# Patient Record
Sex: Female | Born: 1955
Health system: Southern US, Community
[De-identification: ages and names within clinical notes are randomized; demographics above are authoritative.]

## PROBLEM LIST (undated history)

## (undated) DIAGNOSIS — D219 Benign neoplasm of connective and other soft tissue, unspecified: Secondary | ICD-10-CM

## (undated) DIAGNOSIS — J189 Pneumonia, unspecified organism: Secondary | ICD-10-CM

## (undated) DIAGNOSIS — K635 Polyp of colon: Secondary | ICD-10-CM

## (undated) DIAGNOSIS — Z9109 Other allergy status, other than to drugs and biological substances: Secondary | ICD-10-CM

## (undated) DIAGNOSIS — E079 Disorder of thyroid, unspecified: Secondary | ICD-10-CM

## (undated) HISTORY — DX: Disorder of thyroid, unspecified: E07.9

## (undated) HISTORY — PX: VEIN SURGERY: SHX48

## (undated) HISTORY — PX: BASAL CELL CARCINOMA EXCISION: SHX1214

## (undated) HISTORY — DX: Polyp of colon: K63.5

## (undated) HISTORY — DX: Other allergy status, other than to drugs and biological substances: Z91.09

## (undated) HISTORY — DX: Pneumonia, unspecified organism: J18.9

## (undated) HISTORY — DX: Benign neoplasm of connective and other soft tissue, unspecified: D21.9

---

## 1997-06-20 HISTORY — PX: KNEE ARTHROSCOPY: SUR90

## 1998-07-16 ENCOUNTER — Ambulatory Visit (HOSPITAL_BASED_OUTPATIENT_CLINIC_OR_DEPARTMENT_OTHER): Admission: RE | Admit: 1998-07-16 | Discharge: 1998-07-16 | Payer: Self-pay | Admitting: Orthopedic Surgery

## 2000-06-15 ENCOUNTER — Other Ambulatory Visit: Admission: RE | Admit: 2000-06-15 | Discharge: 2000-06-15 | Payer: Self-pay | Admitting: Obstetrics and Gynecology

## 2001-03-07 ENCOUNTER — Encounter: Payer: Self-pay | Admitting: Obstetrics and Gynecology

## 2001-03-07 ENCOUNTER — Encounter: Admission: RE | Admit: 2001-03-07 | Discharge: 2001-03-07 | Payer: Self-pay | Admitting: Obstetrics and Gynecology

## 2001-09-13 ENCOUNTER — Other Ambulatory Visit: Admission: RE | Admit: 2001-09-13 | Discharge: 2001-09-13 | Payer: Self-pay | Admitting: Obstetrics and Gynecology

## 2002-10-11 ENCOUNTER — Other Ambulatory Visit: Admission: RE | Admit: 2002-10-11 | Discharge: 2002-10-11 | Payer: Self-pay | Admitting: Obstetrics and Gynecology

## 2003-01-08 ENCOUNTER — Encounter: Admission: RE | Admit: 2003-01-08 | Discharge: 2003-01-08 | Payer: Self-pay | Admitting: Obstetrics and Gynecology

## 2003-01-08 ENCOUNTER — Encounter: Payer: Self-pay | Admitting: Obstetrics and Gynecology

## 2003-12-31 ENCOUNTER — Other Ambulatory Visit: Admission: RE | Admit: 2003-12-31 | Discharge: 2003-12-31 | Payer: Self-pay | Admitting: Obstetrics and Gynecology

## 2004-09-13 ENCOUNTER — Ambulatory Visit: Payer: Self-pay | Admitting: Internal Medicine

## 2005-04-26 ENCOUNTER — Other Ambulatory Visit: Admission: RE | Admit: 2005-04-26 | Discharge: 2005-04-26 | Payer: Self-pay | Admitting: Obstetrics and Gynecology

## 2005-05-05 ENCOUNTER — Encounter: Admission: RE | Admit: 2005-05-05 | Discharge: 2005-05-05 | Payer: Self-pay | Admitting: Obstetrics and Gynecology

## 2005-06-02 ENCOUNTER — Ambulatory Visit: Payer: Self-pay | Admitting: Internal Medicine

## 2006-05-02 ENCOUNTER — Ambulatory Visit: Payer: Self-pay | Admitting: Internal Medicine

## 2006-06-20 DIAGNOSIS — K635 Polyp of colon: Secondary | ICD-10-CM

## 2006-06-20 HISTORY — DX: Polyp of colon: K63.5

## 2006-07-17 ENCOUNTER — Encounter: Admission: RE | Admit: 2006-07-17 | Discharge: 2006-07-17 | Payer: Self-pay | Admitting: Obstetrics and Gynecology

## 2006-07-18 ENCOUNTER — Other Ambulatory Visit: Admission: RE | Admit: 2006-07-18 | Discharge: 2006-07-18 | Payer: Self-pay | Admitting: Obstetrics & Gynecology

## 2007-01-22 ENCOUNTER — Ambulatory Visit: Payer: Self-pay | Admitting: Internal Medicine

## 2007-01-22 DIAGNOSIS — L258 Unspecified contact dermatitis due to other agents: Secondary | ICD-10-CM | POA: Insufficient documentation

## 2007-09-10 ENCOUNTER — Encounter: Admission: RE | Admit: 2007-09-10 | Discharge: 2007-09-10 | Payer: Self-pay | Admitting: Obstetrics and Gynecology

## 2007-09-11 ENCOUNTER — Other Ambulatory Visit: Admission: RE | Admit: 2007-09-11 | Discharge: 2007-09-11 | Payer: Self-pay | Admitting: Obstetrics and Gynecology

## 2007-09-20 ENCOUNTER — Ambulatory Visit: Payer: Self-pay | Admitting: Internal Medicine

## 2007-09-20 DIAGNOSIS — J069 Acute upper respiratory infection, unspecified: Secondary | ICD-10-CM | POA: Insufficient documentation

## 2007-10-08 ENCOUNTER — Telehealth: Payer: Self-pay | Admitting: Internal Medicine

## 2007-10-23 ENCOUNTER — Ambulatory Visit: Payer: Self-pay | Admitting: Internal Medicine

## 2007-10-31 ENCOUNTER — Telehealth: Payer: Self-pay | Admitting: Internal Medicine

## 2008-09-24 ENCOUNTER — Other Ambulatory Visit: Admission: RE | Admit: 2008-09-24 | Discharge: 2008-09-24 | Payer: Self-pay | Admitting: Obstetrics & Gynecology

## 2008-10-16 ENCOUNTER — Encounter: Admission: RE | Admit: 2008-10-16 | Discharge: 2008-10-16 | Payer: Self-pay | Admitting: Obstetrics & Gynecology

## 2009-02-16 ENCOUNTER — Ambulatory Visit: Payer: Self-pay | Admitting: Internal Medicine

## 2009-02-16 DIAGNOSIS — L255 Unspecified contact dermatitis due to plants, except food: Secondary | ICD-10-CM | POA: Insufficient documentation

## 2009-03-13 ENCOUNTER — Ambulatory Visit: Payer: Self-pay | Admitting: Internal Medicine

## 2009-03-13 DIAGNOSIS — J180 Bronchopneumonia, unspecified organism: Secondary | ICD-10-CM | POA: Insufficient documentation

## 2009-03-24 ENCOUNTER — Telehealth: Payer: Self-pay | Admitting: Internal Medicine

## 2009-06-24 ENCOUNTER — Telehealth: Payer: Self-pay | Admitting: Internal Medicine

## 2009-06-25 ENCOUNTER — Ambulatory Visit: Payer: Self-pay | Admitting: Internal Medicine

## 2009-06-25 DIAGNOSIS — R059 Cough, unspecified: Secondary | ICD-10-CM | POA: Insufficient documentation

## 2009-06-25 DIAGNOSIS — R05 Cough: Secondary | ICD-10-CM | POA: Insufficient documentation

## 2009-06-25 DIAGNOSIS — E039 Hypothyroidism, unspecified: Secondary | ICD-10-CM | POA: Insufficient documentation

## 2009-06-26 ENCOUNTER — Ambulatory Visit: Payer: Self-pay | Admitting: Internal Medicine

## 2010-05-17 ENCOUNTER — Encounter: Admission: RE | Admit: 2010-05-17 | Discharge: 2010-05-17 | Payer: Self-pay | Admitting: Obstetrics & Gynecology

## 2010-07-20 NOTE — Assessment & Plan Note (Signed)
Summary: cough/ flu vaccine/dm   Vital Signs:  Patient profile:   55 year old female Menstrual status:  irregular Weight:      165 pounds BMI:     27.56 Temp:     98.9 degrees F oral BP sitting:   126 / 68  (left arm) Cuff size:   regular  Vitals Entered By: Raechel Ache, RN (June 25, 2009 4:17 PM) CC: Still has cough Is Patient Diabetic? No Flu Vaccine Consent Questions     Do you have a history of severe allergic reactions to this vaccine? no    Any prior history of allergic reactions to egg and/or gelatin? no    Do you have a sensitivity to the preservative Thimersol? no    Do you have a past history of Guillan-Barre Syndrome? no    Do you currently have an acute febrile illness? no    Have you ever had a severe reaction to latex? no    Vaccine information given and explained to patient? yes    Are you currently pregnant? no    Lot Number:[id number]   Exp Date:12/17/2009   Site Given  Left Deltoid IM   CC:  Still has cough.  History of Present Illness: 55 year old patient who was evaluated and treated for community-acquired pneumonia in an urgent care approximately 3 months ago.  This was confirmed by a chest x-ray.  She was treated with Ceftin.  Since that time.  She has had chronic refractory cough.  Cough is often nocturnal it is productive in the morning, but then seems to clear some throughout the day.  Denies any persistent fever, wheezing, chest pain or shortness of breath; she denies any reflux symptoms;  exercise also tend to aggravate the cough.  She is a nonsmoker and is not exposed to any passive smoking.  Following treatment with the cephalosporin,  she was also treated with doxycycline.  Allergies: No Known Drug Allergies  Past History:  Past Medical History: thyroid problem dx by Gyne Hypothyroidism  Review of Systems       The patient complains of prolonged cough.  The patient denies anorexia, fever, weight loss, weight gain, vision loss, decreased  hearing, hoarseness, chest pain, syncope, dyspnea on exertion, peripheral edema, headaches, hemoptysis, abdominal pain, melena, hematochezia, severe indigestion/heartburn, hematuria, incontinence, genital sores, muscle weakness, suspicious skin lesions, transient blindness, difficulty walking, depression, unusual weight change, abnormal bleeding, enlarged lymph nodes, angioedema, and breast masses.    Physical Exam  General:  overweight-appearing.  healthy no distressoverweight-appearing.   Head:  Normocephalic and atraumatic without obvious abnormalities. No apparent alopecia or balding. Eyes:  No corneal or conjunctival inflammation noted. EOMI. Perrla. Funduscopic exam benign, without hemorrhages, exudates or papilledema. Vision grossly normal. Ears:  External ear exam shows no significant lesions or deformities.  Otoscopic examination reveals clear canals, tympanic membranes are intact bilaterally without bulging, retraction, inflammation or discharge. Hearing is grossly normal bilaterally. Mouth:  Oral mucosa and oropharynx without lesions or exudates.  Teeth in good repair. Neck:  No deformities, masses, or tenderness noted. Lungs:  Normal respiratory effort, chest expands symmetrically. Lungs are clear to auscultation, no crackles or wheezes. Heart:  Normal rate and regular rhythm. S1 and S2 normal without gallop, murmur, click, rub or other extra sounds.   Impression & Recommendations:  Problem # 1:  COUGH (ICD-786.2)  patient has had a persistent, cough, status post treatment for a community-acquired pneumonia.  Will check a follow-up chest x-ray and continue symptomatic  treatment.  Will empirically place on PPI therapy  Orders: T-2 View CXR (71020TC)  Problem # 2:  BRONCHIAL PNEUMONIA (ICD-485)  The following medications were removed from the medication list:    Doxycycline Hyclate 100 Mg Tabs (Doxycycline hyclate) ..... One by mouth two times a day x 7 days  Problem # 3:   HYPOTHYROIDISM (ICD-244.9)  Her updated medication list for this problem includes:    Levothyroxine Sodium 50 Mcg Tabs (Levothyroxine sodium) .Marland Kitchen... 1 once daily    Her updated medication list for this problem includes:    Levothyroxine Sodium 50 Mcg Tabs (Levothyroxine sodium) .Marland Kitchen... 1 once daily  Orders: Venipuncture (16109) TLB-TSH (Thyroid Stimulating Hormone) (84443-TSH)  Complete Medication List: 1)  Fluticasone Propionate 50 Mcg/act Susp (Fluticasone propionate) .... Useqd as needed 2)  Levothyroxine Sodium 50 Mcg Tabs (Levothyroxine sodium) .Marland Kitchen.. 1 once daily 3)  Bcp  .Marland Kitchen.. 1 once daily 4)  Hydrocodone-homatropine 5-1.5 Mg/8ml Syrp (Hydrocodone-homatropine) .... One  tsp every 6 hours for cough  Other Orders: Admin 1st Vaccine (60454) Flu Vaccine 75yrs + (09811)  Patient Instructions: 1)  chest x-ray tomorrow 2)  call if unimproved 3)  aciphex one daily Prescriptions: HYDROCODONE-HOMATROPINE 5-1.5 MG/5ML SYRP (HYDROCODONE-HOMATROPINE) one  tsp every 6 hours for cough  #6 oz x 2   Entered and Authorized by:   Gordy Savers  MD   Signed by:   Gordy Savers  MD on 06/25/2009   Method used:   Print then Give to Patient   RxID:   [id number]

## 2010-07-20 NOTE — Progress Notes (Signed)
Summary: cough  Phone Note Call from Patient   Caller: Patient Call For: Gordy Savers  MD Summary of Call: Pt is still wanting to get the flu vaccine, but she is still coughing from her bout of pneumonia.  Can she have one, or should she make an office visit?  Not feeling ill...Marland KitchenMarland KitchenMarland Kitchenjust residual cough. [phone] Initial call taken by: Lynann Beaver CMA,  June 24, 2009 8:50 AM  Follow-up for Phone Call        Pt given Dr. Charm Rings recommendations. Follow-up by: Lynann Beaver CMA,  June 24, 2009 9:49 AM    OIn thek for flu vac if no fever Per Dr. Kirtland Bouchard

## 2010-07-26 ENCOUNTER — Encounter: Payer: Self-pay | Admitting: Internal Medicine

## 2010-07-26 ENCOUNTER — Ambulatory Visit (INDEPENDENT_AMBULATORY_CARE_PROVIDER_SITE_OTHER): Payer: BC Managed Care – PPO | Admitting: Internal Medicine

## 2010-07-26 DIAGNOSIS — R059 Cough, unspecified: Secondary | ICD-10-CM

## 2010-07-26 DIAGNOSIS — R05 Cough: Secondary | ICD-10-CM

## 2010-07-26 DIAGNOSIS — J069 Acute upper respiratory infection, unspecified: Secondary | ICD-10-CM

## 2010-07-26 MED ORDER — HYDROCODONE-HOMATROPINE 5-1.5 MG/5ML PO SYRP: 5.0000 mL | ORAL_SOLUTION | Freq: Four times a day (QID) | ORAL | Status: DC | PRN

## 2010-07-26 NOTE — Progress Notes (Signed)
  Subjective:    Patient ID: Sabrina Gibbs, female    DOB: 1955/11/18, 55 y.o.   MRN: [id number]  HPI  55 year old patient who presents with a 5 day history of head and chest congestion.  She has now developed more of a productive cough.  Cough is interfering with sleep.  She does have a history of allergic rhinitis, as well as hypothyroidism.  There is been some intermittent fever controlled with aspirin.  Cough is nonproductive.  Denies any shortness of breath, chest pain.   Review of Systems  Constitutional: Positive for fatigue.  HENT: Positive for congestion. Negative for hearing loss, sore throat, rhinorrhea, dental problem, sinus pressure and tinnitus.   Eyes: Negative for pain, discharge and visual disturbance.  Respiratory: Positive for cough. Negative for shortness of breath.   Cardiovascular: Negative for chest pain, palpitations and leg swelling.  Gastrointestinal: Negative for nausea, vomiting, abdominal pain, diarrhea, constipation, blood in stool and abdominal distention.  Genitourinary: Negative for dysuria, urgency, frequency, hematuria, flank pain, vaginal bleeding, vaginal discharge, difficulty urinating, vaginal pain and pelvic pain.  Musculoskeletal: Negative for joint swelling, arthralgias and gait problem.  Skin: Negative for rash.  Neurological: Negative for dizziness, syncope, speech difficulty, weakness, numbness and headaches.  Hematological: Negative for adenopathy. Does not bruise/bleed easily.  Psychiatric/Behavioral: Negative for behavioral problems, dysphoric mood and agitation. The patient is not nervous/anxious.        Objective:   Physical Exam  Constitutional: She is oriented to person, place, and time. She appears well-developed and well-nourished.  HENT:  Head: Normocephalic and atraumatic.  Right Ear: External ear normal.  Left Ear: External ear normal.  Mouth/Throat: Oropharynx is clear and moist.  Eyes: Conjunctivae and EOM are normal. Pupils  are equal, round, and reactive to light.  Neck: Normal range of motion. Neck supple. No thyromegaly present.  Cardiovascular: Normal rate, regular rhythm, normal heart sounds and intact distal pulses.   Pulmonary/Chest: Effort normal and breath sounds normal.  Abdominal: Soft. Bowel sounds are normal. She exhibits no mass. There is no tenderness.  Musculoskeletal: Normal range of motion.  Lymphadenopathy:    She has no cervical adenopathy.  Neurological: She is alert and oriented to person, place, and time.  Skin: Skin is warm and dry. No rash noted.  Psychiatric: She has a normal mood and affect. Her behavior is normal.          Assessment & Plan:

## 2010-07-26 NOTE — Patient Instructions (Signed)
Get plenty of rest, Drink lots of  clear liquids, and use Tylenol or ibuprofen for fever and discomfort.    

## 2010-07-26 NOTE — Assessment & Plan Note (Signed)
Will treat symptomatically for her URI; new prescription for her cough medicine dispensed.

## 2010-07-28 ENCOUNTER — Encounter: Payer: Self-pay | Admitting: Internal Medicine

## 2011-02-28 ENCOUNTER — Other Ambulatory Visit: Payer: Self-pay | Admitting: Obstetrics & Gynecology

## 2011-02-28 DIAGNOSIS — N6321 Unspecified lump in the left breast, upper outer quadrant: Secondary | ICD-10-CM

## 2011-03-07 ENCOUNTER — Ambulatory Visit
Admission: RE | Admit: 2011-03-07 | Discharge: 2011-03-07 | Disposition: A | Discharge status: Home / Self Care | Payer: BC Managed Care – PPO | Source: Ambulatory Visit | Attending: Obstetrics & Gynecology | Admitting: Obstetrics & Gynecology

## 2011-03-07 DIAGNOSIS — N6321 Unspecified lump in the left breast, upper outer quadrant: Secondary | ICD-10-CM

## 2011-04-15 ENCOUNTER — Ambulatory Visit (INDEPENDENT_AMBULATORY_CARE_PROVIDER_SITE_OTHER): Payer: BC Managed Care – PPO

## 2011-04-15 DIAGNOSIS — Z23 Encounter for immunization: Secondary | ICD-10-CM

## 2011-06-07 ENCOUNTER — Other Ambulatory Visit: Payer: Self-pay | Admitting: Obstetrics & Gynecology

## 2011-06-07 DIAGNOSIS — Z1231 Encounter for screening mammogram for malignant neoplasm of breast: Secondary | ICD-10-CM

## 2011-06-23 ENCOUNTER — Ambulatory Visit: Payer: BC Managed Care – PPO

## 2011-06-29 ENCOUNTER — Ambulatory Visit
Admission: RE | Admit: 2011-06-29 | Discharge: 2011-06-29 | Disposition: A | Discharge status: Home / Self Care | Payer: BC Managed Care – PPO | Source: Ambulatory Visit | Attending: Obstetrics & Gynecology | Admitting: Obstetrics & Gynecology

## 2011-06-29 DIAGNOSIS — Z1231 Encounter for screening mammogram for malignant neoplasm of breast: Secondary | ICD-10-CM

## 2012-02-06 ENCOUNTER — Other Ambulatory Visit: Payer: Self-pay | Admitting: Obstetrics & Gynecology

## 2012-02-06 DIAGNOSIS — R1013 Epigastric pain: Secondary | ICD-10-CM

## 2012-02-07 ENCOUNTER — Ambulatory Visit
Admission: RE | Admit: 2012-02-07 | Discharge: 2012-02-07 | Disposition: A | Discharge status: Home / Self Care | Payer: BC Managed Care – PPO | Source: Ambulatory Visit | Attending: Obstetrics & Gynecology | Admitting: Obstetrics & Gynecology

## 2012-02-07 DIAGNOSIS — R1013 Epigastric pain: Secondary | ICD-10-CM

## 2012-02-21 ENCOUNTER — Ambulatory Visit (INDEPENDENT_AMBULATORY_CARE_PROVIDER_SITE_OTHER): Payer: BC Managed Care – PPO | Admitting: Internal Medicine

## 2012-02-21 ENCOUNTER — Encounter: Payer: Self-pay | Admitting: Internal Medicine

## 2012-02-21 VITALS — BP 110/70 | Temp 98.2°F | Ht 65.0 in | Wt 146.0 lb

## 2012-02-21 DIAGNOSIS — L255 Unspecified contact dermatitis due to plants, except food: Secondary | ICD-10-CM

## 2012-02-21 DIAGNOSIS — E039 Hypothyroidism, unspecified: Secondary | ICD-10-CM

## 2012-02-21 MED ORDER — PREDNISONE 10 MG PO KIT: PACK | ORAL | Status: DC

## 2012-02-21 MED ORDER — METHYLPREDNISOLONE ACETATE 80 MG/ML IJ SUSP
80.0000 mg | Freq: Once | INTRAMUSCULAR | Status: AC
  Administered 2012-02-21: 80 mg via INTRAMUSCULAR

## 2012-02-21 NOTE — Patient Instructions (Signed)
Call or return to clinic prn if these symptoms worsen or fail to improve as anticipated.  Poison [employer] ivy is a inflammation of the skin (contact dermatitis) caused by touching the allergens on the leaves of the ivy plant following previous exposure to the plant. The rash usually appears 48 hours after exposure. The rash is usually bumps (papules) or blisters (vesicles) in a linear pattern. Depending on your own sensitivity, the rash may simply cause redness and itching, or it may also progress to blisters which may break open. These must be well cared for to prevent secondary bacterial (germ) infection, followed by scarring. Keep any open areas dry, clean, dressed, and covered with an antibacterial ointment if needed. The eyes may also get puffy. The puffiness is worst in the morning and gets better as the day progresses. This dermatitis usually heals without scarring, within 2 to 3 weeks without treatment. HOME CARE INSTRUCTIONS   Thoroughly wash with soap and water as soon as you have been exposed to poison ivy. You have about one half hour to remove the plant resin before it will cause the rash. This washing will destroy the oil or antigen on the skin that is causing, or will cause, the rash. Be sure to wash under your fingernails as any plant resin there will continue to spread the rash. Do not rub skin vigorously when washing affected area. Poison ivy cannot spread if no oil from the plant remains on your body. A rash that has progressed to weeping sores will not spread the rash unless you have not washed thoroughly. It is also important to wash any clothes you have been wearing as these may carry active allergens. The rash will return if you wear the unwashed clothing, even several days later. Avoidance of the plant in the future is the best measure. Poison ivy plant can be recognized by the number of leaves. Generally, poison ivy has three leaves with flowering branches on a single  stem. Diphenhydramine may be purchased over the counter and used as needed for itching. Do not drive with this medication if it makes you drowsy.Ask your caregiver about medication for children. SEEK MEDICAL CARE IF:  Open sores develop.   Redness spreads beyond area of rash.   You notice purulent (pus-like) discharge.   You have increased pain.   Other signs of infection develop (such as fever).  Document Released: 06/03/2000 Document Revised: 05/26/2011 Document Reviewed: 04/22/2009 [facility] Patient Information 2012 [place], [place].

## 2012-02-21 NOTE — Progress Notes (Signed)
  Subjective:    Patient ID: Sabrina Gibbs, female    DOB: [birth year], 56 y.o.   MRN: [id number]  HPI  56 year old patient who has a history of contact dermatitis who presents with a 2 to three-day history of a weeping pruritic rash involving primarily her extremities after yard work.  Past Medical History  Diagnosis Date  . Thyroid disease     History   Social History  . Marital Status: Married    Spouse Name: N/A    Number of Children: N/A  . Years of Education: N/A   Occupational History  . Not on file.   Social History Main Topics  . Smoking status: Never Smoker   . Smokeless tobacco: Never Used  . Alcohol Use: 8.4 oz/week    14 Glasses of wine per week  . Drug Use: No  . Sexually Active: Not on file   Other Topics Concern  . Not on file   Social History Narrative  . No narrative on file    Past Surgical History  Procedure Date  . Knee arthroscopy     Family History  Problem Relation Age of Onset  . Heart disease Father     No Known Allergies  Current Outpatient Prescriptions on File Prior to Visit  Medication Sig Dispense Refill  . fluticasone (FLONASE) 50 MCG/ACT nasal spray 1 spray by Nasal route daily.        Marland Kitchen levothyroxine (SYNTHROID, LEVOTHROID) 50 MCG tablet Take 50 mcg by mouth daily.        Marland Kitchen HYDROcodone-homatropine (HYDROMET) 5-1.5 MG/5ML syrup Take 5 mLs by mouth 4 (four) times daily as needed.  120 mL  1    BP 110/70  Temp 98.2 F (36.8 C) (Oral)  Ht 5\' 5"  (1.651 m)  Wt 146 lb (66.225 kg)  BMI 24.30 kg/m2       Review of Systems     Objective:   Physical Exam  Skin: Rash noted.       Weeping vesicles involving the extremities most marked involving the left lower arm          Assessment & Plan:    Contact dermatitis. Will treat with Depo-Medrol. Will treat with calamine lotion until lesions are more dried out. Will also give a prescription for oral prednisone for later use if needed

## 2012-07-17 ENCOUNTER — Other Ambulatory Visit: Payer: Self-pay | Admitting: Obstetrics & Gynecology

## 2012-07-17 DIAGNOSIS — Z1231 Encounter for screening mammogram for malignant neoplasm of breast: Secondary | ICD-10-CM

## 2012-08-29 ENCOUNTER — Ambulatory Visit
Admission: RE | Admit: 2012-08-29 | Discharge: 2012-08-29 | Disposition: A | Discharge status: Home / Self Care | Payer: BC Managed Care – PPO | Source: Ambulatory Visit | Attending: Obstetrics & Gynecology | Admitting: Obstetrics & Gynecology

## 2012-08-29 ENCOUNTER — Other Ambulatory Visit: Payer: Self-pay | Admitting: Obstetrics & Gynecology

## 2012-08-29 DIAGNOSIS — R109 Unspecified abdominal pain: Secondary | ICD-10-CM

## 2012-08-29 DIAGNOSIS — Z1231 Encounter for screening mammogram for malignant neoplasm of breast: Secondary | ICD-10-CM

## 2012-08-29 MED ORDER — IOHEXOL 300 MG/ML  SOLN
100.0000 mL | Freq: Once | INTRAMUSCULAR | Status: AC | PRN
  Administered 2012-08-29: 100 mL via INTRAVENOUS

## 2012-09-17 ENCOUNTER — Telehealth: Payer: Self-pay | Admitting: Obstetrics & Gynecology

## 2012-09-17 NOTE — Telephone Encounter (Signed)
Spoke with pt who will be out of town week of appt 04-25-13. Sched appt with SM 04-30-13 at 1245 for AEX.  aa

## 2012-09-17 NOTE — Telephone Encounter (Signed)
PT WILL BE OUT OF TOWN ON NOV 6 WHEN HER APPT IS SCHEDULED WITH DR MILLER. WOULD LIKE TO GET IN THE FOLLOWING WEEK IF POSSIBLE.

## 2013-01-14 ENCOUNTER — Telehealth: Payer: Self-pay | Admitting: *Deleted

## 2013-01-14 NOTE — Telephone Encounter (Signed)
Fax request from pharmacy requesting authorization to change manufacturers for LEVOTHYROXINE.  Pt is currrently receiving med from [facility], pharmacy would like to use [facility].   Are you OK with this change or does pt need to stay with [facility]? Please advise.  Paper chart on your desk.  Thanks.

## 2013-01-14 NOTE — Telephone Encounter (Signed)
I don't have a preference.  Pharmacies change manufacturers all the time based on price.  If she wants to stay with one manufacturer, she may end up needing to call around to different pharmacies.

## 2013-01-15 NOTE — Telephone Encounter (Signed)
Pt voices understanding.  She has no preference.  She did note having an issue with [facility] and will start having RX filled at [facility].  Pt will call to have RX transferred.

## 2013-04-09 ENCOUNTER — Telehealth: Payer: Self-pay | Admitting: Obstetrics & Gynecology

## 2013-04-09 MED ORDER — LEVOTHYROXINE SODIUM 50 MCG PO TABS: 50.0000 ug | ORAL_TABLET | Freq: Every day | ORAL | Status: DC

## 2013-04-09 NOTE — Telephone Encounter (Signed)
Patient needs refill for synthroid medication. She also wanting 3 mths at a time instead one 1.

## 2013-04-09 NOTE — Telephone Encounter (Signed)
Annual Exam scheduled for 05/06/13, pt asking for a 90day supply of Levothyroxine . Rx sent to [facility] for 90day supply Per Dr. Hyacinth Meeker

## 2013-04-10 ENCOUNTER — Telehealth: Payer: Self-pay | Admitting: *Deleted

## 2013-04-10 MED ORDER — LEVOTHYROXINE SODIUM 50 MCG PO TABS: 50.0000 ug | ORAL_TABLET | Freq: Every day | ORAL | Status: DC

## 2013-04-10 NOTE — Telephone Encounter (Signed)
Pt states that RX is not at [facility].  I spoke with the patient to let her know that we did refill Levothyroxine yesterday, but we sent it to the wrong pharmacy.  Apologized to pt for our error and advised I would send to [facility] and cancel at [facility].  Per Orene Desanctis is canceled at [facility]. RX sent to [facility] for 90 day supply.

## 2013-04-25 ENCOUNTER — Ambulatory Visit: Payer: Self-pay | Admitting: Obstetrics & Gynecology

## 2013-04-30 ENCOUNTER — Ambulatory Visit: Payer: Self-pay | Admitting: Obstetrics & Gynecology

## 2013-05-06 ENCOUNTER — Encounter: Payer: Self-pay | Admitting: Obstetrics & Gynecology

## 2013-05-06 ENCOUNTER — Ambulatory Visit (INDEPENDENT_AMBULATORY_CARE_PROVIDER_SITE_OTHER): Payer: BC Managed Care – PPO | Admitting: Obstetrics & Gynecology

## 2013-05-06 ENCOUNTER — Ambulatory Visit: Payer: Self-pay | Admitting: Obstetrics & Gynecology

## 2013-05-06 VITALS — BP 138/88 | HR 60 | Resp 16 | Ht 64.5 in | Wt 150.6 lb

## 2013-05-06 DIAGNOSIS — Z01419 Encounter for gynecological examination (general) (routine) without abnormal findings: Secondary | ICD-10-CM

## 2013-05-06 DIAGNOSIS — Z0000 Encounter for general adult medical examination without abnormal findings: Secondary | ICD-10-CM

## 2013-05-06 LAB — COMPREHENSIVE METABOLIC PANEL
ALT: 20 U/L (ref 0–35)
AST: 23 U/L (ref 0–37)
Alkaline Phosphatase: 66 U/L (ref 39–117)
CO2: 27 mEq/L (ref 19–32)
Creat: 0.62 mg/dL (ref 0.50–1.10)
Sodium: 140 mEq/L (ref 135–145)
Total Bilirubin: 0.6 mg/dL (ref 0.3–1.2)
Total Protein: 7.1 g/dL (ref 6.0–8.3)

## 2013-05-06 LAB — LIPID PANEL
HDL: 99 mg/dL (ref >39)
LDL Cholesterol: 88 mg/dL (ref 0–99)
Total CHOL/HDL Ratio: 2.1 Ratio
Triglycerides: 107 mg/dL (ref <150)
VLDL: 21 mg/dL (ref 0–40)

## 2013-05-06 MED ORDER — LEVOTHYROXINE SODIUM 50 MCG PO TABS: 50.0000 ug | ORAL_TABLET | Freq: Every day | ORAL | Status: DC

## 2013-05-06 NOTE — Patient Instructions (Signed)

## 2013-05-06 NOTE — Progress Notes (Signed)
57 y.o. G0P0000 MarriedCaucasianF here for annual exam.  Husband has hip replacement with Dr. Despina Hick mid September.  Husband has bone disease and now having back pain.  No vaginal bleeding.    Patient's last menstrual period was 10/19/2010.          Sexually active: yes  The current method of family planning is none.    Exercising: yes  walking dog and bike riding Smoker:  no  Health Maintenance: Pap:  04/23/12 WNL/negative HR HPV History of abnormal Pap:  no MMG:  08/29/12 3D normal Colonoscopy:  04/23/07 repeat in 10 years (Dr. Loreta Ave) BMD:   none TDaP:  02/28/11  Screening Labs: today, Hb today: 13.4, Urine today: today   reports that she has never smoked. She has never used smokeless tobacco. She reports that she drinks about 4.2 ounces of alcohol per week. She reports that she does not use illicit drugs.  Past Medical History  Diagnosis Date  . Thyroid disease   . Fibroid   . Colon polyp, hyperplastic 2008    Past Surgical History  Procedure Laterality Date  . Knee arthroscopy Left 1999    Current Outpatient Prescriptions  Medication Sig Dispense Refill  . fluticasone (FLONASE) 50 MCG/ACT nasal spray 1 spray by Nasal route daily.        Marland Kitchen levothyroxine (SYNTHROID, LEVOTHROID) 50 MCG tablet Take 1 tablet (50 mcg total) by mouth daily.  90 tablet  0  . Vitamin D, Ergocalciferol, (DRISDOL) 50000 UNITS CAPS Take 50,000 Units by mouth every 14 (fourteen) days.        No current facility-administered medications for this visit.    Family History  Problem Relation Age of Onset  . Heart disease Father   . Hypertension Father   . Fibromyalgia Brother   . Heart disease Mother   . Atrial fibrillation Mother     ROS:  Pertinent items are noted in HPI.  Otherwise, a comprehensive ROS was negative.  Exam:   BP 138/88  Pulse 60  Resp 16  Ht 5' 4.5" (1.638 m)  Wt 150 lb 9.6 oz (68.312 kg)  BMI 25.46 kg/m2  LMP 10/19/2010  Weight change: +8lbs  Height: 5' 4.5" (163.8 cm)  Ht  Readings from Last 3 Encounters:  05/06/13 5' 4.5" (1.638 m)  02/21/12 5\' 5"  (1.651 m)  07/26/10 5' 4.5" (1.638 m)    General appearance: alert, cooperative and appears stated age Head: Normocephalic, without obvious abnormality, atraumatic Neck: no adenopathy, supple, symmetrical, trachea midline and thyroid normal to inspection and palpation Lungs: clear to auscultation bilaterally Breasts: normal appearance, no masses or tenderness Heart: regular rate and rhythm Abdomen: soft, non-tender; bowel sounds normal; no masses,  no organomegaly Extremities: extremities normal, atraumatic, no cyanosis or edema Skin: Skin color, texture, turgor normal. No rashes or lesions Lymph nodes: Cervical, supraclavicular, and axillary nodes normal. No abnormal inguinal nodes palpated Neurologic: Grossly normal   Pelvic: External genitalia:  no lesions              Urethra:  normal appearing urethra with no masses, tenderness or lesions              Bartholins and Skenes: normal                 Vagina: normal appearing vagina with normal color and discharge, no lesions              Cervix: no lesions  Pap taken: no Bimanual Exam:  Uterus:  normal size, contour, position, consistency, mobility, non-tender              Adnexa: normal adnexa and no mass, fullness, tenderness               Rectovaginal: Confirms               Anus:  normal sphincter tone, no lesions  A:  Well Woman with normal exam H/O degenerating 6cm subserosla fibroids Hypothyroidism PMP, No HRT Seasonal allergies  P:   Mammogram yearly.   pap smear with neg HR HPV  11/13.  No Pap today TSH, CMP, lipids, and Vit D today. Rx for synthroid daily.  #90/4RF.   Will need rx for Vit D but waiting for lab results. return annually or prn  An After Visit Summary was printed and given to the patient.

## 2013-05-07 ENCOUNTER — Encounter: Payer: Self-pay | Admitting: Obstetrics & Gynecology

## 2013-05-07 LAB — VITAMIN D 25 HYDROXY (VIT D DEFICIENCY, FRACTURES): Vit D, 25-Hydroxy: 52 ng/mL (ref 30–89)

## 2013-08-16 ENCOUNTER — Telehealth: Payer: Self-pay | Admitting: Obstetrics & Gynecology

## 2013-08-16 ENCOUNTER — Other Ambulatory Visit: Payer: Self-pay | Admitting: Obstetrics & Gynecology

## 2013-08-16 MED ORDER — LEVOTHYROXINE SODIUM 50 MCG PO TABS: 50.0000 ug | ORAL_TABLET | Freq: Every day | ORAL | Status: DC

## 2013-08-16 NOTE — Telephone Encounter (Signed)
Made in error

## 2013-08-16 NOTE — Telephone Encounter (Addendum)
Last AEX, TSH lab and refill 05/06/13 #90/4 refills sent to [facility].  Next appt 06/06/14  -LM for pt to call back re: Pt using [facility] now? If so, she can call [facility] to transfer rx to [facility].   Pt called back. She called [facility] and transfer rx but they didn't transfer the refills.  Will refill rx till 04/2014. - Patient agreed.

## 2013-08-16 NOTE — Addendum Note (Signed)
Addended by: Elroy Channel on: 08/16/2013 03:30 PM   Modules accepted: Orders

## 2013-11-26 ENCOUNTER — Other Ambulatory Visit: Payer: Self-pay

## 2013-11-26 DIAGNOSIS — Z1231 Encounter for screening mammogram for malignant neoplasm of breast: Secondary | ICD-10-CM

## 2013-11-28 ENCOUNTER — Ambulatory Visit
Admission: RE | Admit: 2013-11-28 | Discharge: 2013-11-28 | Disposition: A | Discharge status: Home / Self Care | Payer: BC Managed Care – PPO | Source: Ambulatory Visit

## 2013-11-28 DIAGNOSIS — Z1231 Encounter for screening mammogram for malignant neoplasm of breast: Secondary | ICD-10-CM

## 2014-06-06 ENCOUNTER — Encounter: Payer: Self-pay | Admitting: Obstetrics & Gynecology

## 2014-06-06 ENCOUNTER — Ambulatory Visit (INDEPENDENT_AMBULATORY_CARE_PROVIDER_SITE_OTHER): Payer: BC Managed Care – PPO | Admitting: Obstetrics & Gynecology

## 2014-06-06 ENCOUNTER — Ambulatory Visit
Admission: RE | Admit: 2014-06-06 | Discharge: 2014-06-06 | Disposition: A | Discharge status: Home / Self Care | Payer: BC Managed Care – PPO | Source: Ambulatory Visit | Attending: Obstetrics & Gynecology | Admitting: Obstetrics & Gynecology

## 2014-06-06 VITALS — BP 118/82 | HR 64 | Resp 16 | Ht 64.75 in | Wt 142.6 lb

## 2014-06-06 DIAGNOSIS — Z124 Encounter for screening for malignant neoplasm of cervix: Secondary | ICD-10-CM

## 2014-06-06 DIAGNOSIS — Z0000 Encounter for general adult medical examination without abnormal findings: Secondary | ICD-10-CM

## 2014-06-06 DIAGNOSIS — Z01419 Encounter for gynecological examination (general) (routine) without abnormal findings: Secondary | ICD-10-CM

## 2014-06-06 DIAGNOSIS — E079 Disorder of thyroid, unspecified: Secondary | ICD-10-CM

## 2014-06-06 LAB — POCT URINALYSIS DIPSTICK
Bilirubin, UA: NEGATIVE
Glucose, UA: NEGATIVE
KETONES UA: NEGATIVE
NITRITE UA: NEGATIVE
PH UA: 5
PROTEIN UA: NEGATIVE
Urobilinogen, UA: NEGATIVE

## 2014-06-06 LAB — COMPREHENSIVE METABOLIC PANEL
ALT: 15 U/L (ref 0–35)
AST: 22 U/L (ref 0–37)
Albumin: 4.6 g/dL (ref 3.5–5.2)
Alkaline Phosphatase: 77 U/L (ref 39–117)
BILIRUBIN TOTAL: 0.6 mg/dL (ref 0.2–1.2)
BUN: 10 mg/dL (ref 6–23)
CHLORIDE: 101 meq/L (ref 96–112)
CO2: 25 mEq/L (ref 19–32)
CREATININE: 0.56 mg/dL (ref 0.50–1.10)
Calcium: 9.3 mg/dL (ref 8.4–10.5)
Glucose, Bld: 81 mg/dL (ref 70–99)
Potassium: 3.9 mEq/L (ref 3.5–5.3)
Sodium: 142 mEq/L (ref 135–145)
Total Protein: 7.1 g/dL (ref 6.0–8.3)

## 2014-06-06 LAB — HEMOGLOBIN, FINGERSTICK: Hemoglobin, fingerstick: 13.9 g/dL (ref 12.0–16.0)

## 2014-06-06 LAB — LIPID PANEL
CHOLESTEROL: 196 mg/dL (ref 0–200)
HDL: 91 mg/dL (ref >39)
LDL Cholesterol: 61 mg/dL (ref 0–99)
TRIGLYCERIDES: 222 mg/dL — AB (ref <150)
Total CHOL/HDL Ratio: 2.2 Ratio
VLDL: 44 mg/dL — AB (ref 0–40)

## 2014-06-06 MED ORDER — LEVOTHYROXINE SODIUM 50 MCG PO TABS: 50.0000 ug | ORAL_TABLET | Freq: Every day | ORAL | Status: DC

## 2014-06-06 MED ORDER — VITAMIN D (ERGOCALCIFEROL) 1.25 MG (50000 UNIT) PO CAPS: 50000.0000 [IU] | ORAL_CAPSULE | ORAL | Status: DC

## 2014-06-06 NOTE — Progress Notes (Signed)
Thyroid ultrasound schedule for patient today at 1500 at  [facility] at [address], [place] [address].  Patient agreeable to time/date/location.

## 2014-06-06 NOTE — Progress Notes (Signed)
58 y.o. G0P0000 MarriedCaucasianF here for annual exam.  Doing really well.  No vaginal bleeding.    Patient's last menstrual period was 10/19/2010.          Sexually active: Yes.    The current method of family planning is post menopausal status.    Exercising: Yes.    walking and biking Smoker:  no  Health Maintenance: Pap:  04/23/12 WNL/negative HR HPV History of abnormal Pap:  no MMG:  11/28/13-normal Colonoscopy:  04/23/07-repeat in 10 years Dr Collene Mares BMD:   none TDaP:  02/28/11 Screening Labs: today, Hb today: 13.9, Urine today: WBC-trace, RBC-trace   reports that she has never smoked. She has never used smokeless tobacco. She reports that she drinks about 8.4 oz of alcohol per week. She reports that she does not use illicit drugs.  Past Medical History  Diagnosis Date  . Thyroid disease   . Fibroid   . Colon polyp, hyperplastic 2008    Past Surgical History  Procedure Laterality Date  . Knee arthroscopy Left 1999    Current Outpatient Prescriptions  Medication Sig Dispense Refill  . AFLURIA PRESERVATIVE FREE 0.5 ML SUSY   0  . fluticasone (FLONASE) 50 MCG/ACT nasal spray Place into the nose daily. Using 1-2 sprays daily    . levothyroxine (SYNTHROID, LEVOTHROID) 50 MCG tablet Take 1 tablet (50 mcg total) by mouth daily. 90 tablet 2  . Vitamin D, Ergocalciferol, (DRISDOL) 50000 UNITS CAPS Take 50,000 Units by mouth every 14 (fourteen) days.      No current facility-administered medications for this visit.    Family History  Problem Relation Age of Onset  . Heart disease Father   . Hypertension Father   . Fibromyalgia Brother   . Heart disease Mother   . Atrial fibrillation Mother     ROS:  Pertinent items are noted in HPI.  Otherwise, a comprehensive ROS was negative.  Exam:   LMP 10/19/2010      Ht Readings from Last 3 Encounters:  05/06/13 5' 4.5" (1.638 m)  02/21/12 5\' 5"  (1.651 m)  07/26/10 5' 4.5" (1.638 m)    General appearance: alert, cooperative and  appears stated age Head: Normocephalic, without obvious abnormality, atraumatic Neck: no adenopathy, supple, symmetrical, trachea midline and feels full Lungs: clear to auscultation bilaterally Breasts: normal appearance, no masses or tenderness Heart: regular rate and rhythm Abdomen: soft, non-tender; bowel sounds normal; no masses,  no organomegaly Extremities: extremities normal, atraumatic, no cyanosis or edema Skin: Skin color, texture, turgor normal. No rashes or lesions Lymph nodes: Cervical, supraclavicular, and axillary nodes normal. No abnormal inguinal nodes palpated Neurologic: Grossly normal   Pelvic: External genitalia:  no lesions              Urethra:  normal appearing urethra with no masses, tenderness or lesions              Bartholins and Skenes: normal                 Vagina: normal appearing vagina with normal color and discharge, no lesions              Cervix: no lesions              Pap taken: Yes.   Bimanual Exam:  Uterus:  enlarged, 8-10 weeks size, globular and similar to one year ago              Adnexa: no mass, fullness, tenderness  Rectovaginal: Confirms               Anus:  normal sphincter tone, no lesions  Chaperone was present for exam.  A:  Well Woman with normal exam H/O uterine fibroids, uterus stable Hypothyroidism Enlarged thyroid on exam today.  No discrete nodules PMP, No HRT Seasonal allergies  P: Mammogram yearly.  pap smear with neg HR HPV 11/13. Pap today. TSH, CMP, lipids today Rx for synthroid 59mcg daily. #90/4RF.  Vit D 50K every 2 weeks.  #6/4 RF Thyroid ultrasound scheduled return annually or prn

## 2014-06-07 LAB — TSH: TSH: 2.501 u[IU]/mL (ref 0.350–4.500)

## 2014-06-09 LAB — IPS PAP TEST WITH REFLEX TO HPV

## 2015-01-09 ENCOUNTER — Other Ambulatory Visit: Payer: Self-pay

## 2015-01-09 DIAGNOSIS — Z1231 Encounter for screening mammogram for malignant neoplasm of breast: Secondary | ICD-10-CM

## 2015-01-21 ENCOUNTER — Ambulatory Visit
Admission: RE | Admit: 2015-01-21 | Discharge: 2015-01-21 | Disposition: A | Discharge status: Home / Self Care | Payer: BC Managed Care – PPO | Source: Ambulatory Visit

## 2015-01-21 DIAGNOSIS — Z1231 Encounter for screening mammogram for malignant neoplasm of breast: Secondary | ICD-10-CM

## 2015-05-20 ENCOUNTER — Ambulatory Visit (INDEPENDENT_AMBULATORY_CARE_PROVIDER_SITE_OTHER): Payer: BC Managed Care – PPO

## 2015-05-20 DIAGNOSIS — Z23 Encounter for immunization: Secondary | ICD-10-CM

## 2015-08-04 ENCOUNTER — Telehealth: Payer: Self-pay | Admitting: Obstetrics & Gynecology

## 2015-08-04 NOTE — Telephone Encounter (Signed)
Left message regarding upcoming appointment has been canceled and needs to be rescheduled. °

## 2015-08-20 ENCOUNTER — Ambulatory Visit: Payer: BC Managed Care – PPO | Admitting: Obstetrics & Gynecology

## 2015-08-25 ENCOUNTER — Ambulatory Visit: Payer: BC Managed Care – PPO | Admitting: Obstetrics & Gynecology

## 2015-08-28 ENCOUNTER — Encounter: Payer: Self-pay | Admitting: Obstetrics & Gynecology

## 2015-08-28 ENCOUNTER — Ambulatory Visit (INDEPENDENT_AMBULATORY_CARE_PROVIDER_SITE_OTHER): Payer: BC Managed Care – PPO | Admitting: Obstetrics & Gynecology

## 2015-08-28 VITALS — BP 136/80 | HR 80 | Resp 16 | Ht 64.5 in | Wt 156.0 lb

## 2015-08-28 DIAGNOSIS — Z0000 Encounter for general adult medical examination without abnormal findings: Secondary | ICD-10-CM

## 2015-08-28 DIAGNOSIS — E2839 Other primary ovarian failure: Secondary | ICD-10-CM

## 2015-08-28 DIAGNOSIS — Z205 Contact with and (suspected) exposure to viral hepatitis: Secondary | ICD-10-CM | POA: Diagnosis not present

## 2015-08-28 DIAGNOSIS — Z01419 Encounter for gynecological examination (general) (routine) without abnormal findings: Secondary | ICD-10-CM

## 2015-08-28 LAB — TSH: TSH: 1.42 mIU/L

## 2015-08-28 MED ORDER — LEVOTHYROXINE SODIUM 50 MCG PO TABS: 50.0000 ug | ORAL_TABLET | Freq: Every day | ORAL | Status: DC

## 2015-08-28 NOTE — Progress Notes (Signed)
60 y.o. G0P0000 MarriedCaucasianF here for annual exam.  Doing well.  No vaginal bleeding.  Tripped over a curb this summer and had a hamstring injury.  Knows she's gained weight.   Patient's last menstrual period was 10/19/2010.          Sexually active: Yes.    The current method of family planning is post menopausal status.    Exercising: Yes.    Walking, biking Smoker:  no  Health Maintenance: Pap:  06/06/14 Neg. 04/23/12 Neg. HR HPV:neg History of abnormal Pap:  no MMG:  01/21/15 BIRADS1:neg Colonoscopy:  04/2007 Normal - repeat 10 years  BMD:   Never TDaP:  02/2011  Screening Labs: today, Urine today: ?   reports that she has never smoked. She has never used smokeless tobacco. She reports that she drinks about 8.4 oz of alcohol per week. She reports that she does not use illicit drugs.  Past Medical History  Diagnosis Date  . Thyroid disease   . Fibroid   . Colon polyp, hyperplastic 2008    Past Surgical History  Procedure Laterality Date  . Knee arthroscopy Left 1999    Current Outpatient Prescriptions  Medication Sig Dispense Refill  . levothyroxine (SYNTHROID, LEVOTHROID) 50 MCG tablet Take 1 tablet (50 mcg total) by mouth daily. 90 tablet 4  . Vitamin D, Ergocalciferol, (DRISDOL) 50000 UNITS CAPS capsule Take 1 capsule (50,000 Units total) by mouth every 14 (fourteen) days. 6 capsule 4   No current facility-administered medications for this visit.    Family History  Problem Relation Age of Onset  . Heart disease Father   . Hypertension Father   . Fibromyalgia Brother   . Heart disease Mother   . Atrial fibrillation Mother     ROS:  Pertinent items are noted in HPI.  Otherwise, a comprehensive ROS was negative.  Exam:   BP 136/80 mmHg  Pulse 80  Resp 16  Ht 5' 4.5" (1.638 m)  Wt 156 lb (70.761 kg)  BMI 26.37 kg/m2  LMP 10/19/2010  Weight change: +14#  Height: 5' 4.5" (163.8 cm)  Ht Readings from Last 3 Encounters:  08/28/15 5' 4.5" (1.638 m)  06/06/14  5' 4.75" (1.645 m)  05/06/13 5' 4.5" (1.638 m)    General appearance: alert, cooperative and appears stated age Head: Normocephalic, without obvious abnormality, atraumatic Neck: no adenopathy, supple, symmetrical, trachea midline and thyroid normal to inspection and palpation Lungs: clear to auscultation bilaterally Breasts: normal appearance, no masses or tenderness Heart: regular rate and rhythm Abdomen: soft, non-tender; bowel sounds normal; no masses,  no organomegaly Extremities: extremities normal, atraumatic, no cyanosis or edema Skin: Skin color, texture, turgor normal. No rashes or lesions Lymph nodes: Cervical, supraclavicular, and axillary nodes normal. No abnormal inguinal nodes palpated Neurologic: Grossly normal   Pelvic: External genitalia:  no lesions              Urethra:  normal appearing urethra with no masses, tenderness or lesions              Bartholins and Skenes: normal                 Vagina: normal appearing vagina with normal color and discharge, no lesions              Cervix: no lesions              Pap taken: No. Bimanual Exam:  Uterus:  enlarged and globular, about 8 weeks size.  Stable  exam.              Adnexa: no mass, fullness, tenderness               Rectovaginal: Confirms               Anus:  normal sphincter tone, no lesions  Chaperone was present for exam.  A:  Well Woman with normal exam H/O uterine fibroids.  Uterus much smaller than a few years ago Hypothyroidism Enlarged thyroid with thyroid US 12/15 PMP, no HRT Seasonal allergies  P: Mammogram yearly.  pap smear with neg HR HPV11/13.  Pap neg 12/15.  No pap today. TSH, Vit D Hep C testing today Rx for synthroid 31mcg daily. #90/4RF.  Will make recommendation about Vit D when results are back.  Pt would like to transition to OTC return annually or prn

## 2015-08-29 LAB — HEPATITIS C ANTIBODY: HCV Ab: NEGATIVE

## 2015-08-29 LAB — VITAMIN D 25 HYDROXY (VIT D DEFICIENCY, FRACTURES): Vit D, 25-Hydroxy: 54 ng/mL (ref 30–100)

## 2015-09-08 ENCOUNTER — Telehealth: Payer: Self-pay | Admitting: Obstetrics & Gynecology

## 2015-09-08 ENCOUNTER — Other Ambulatory Visit: Payer: Self-pay | Admitting: Obstetrics & Gynecology

## 2015-09-08 MED ORDER — VITAMIN D (ERGOCALCIFEROL) 1.25 MG (50000 UNIT) PO CAPS: 50000.0000 [IU] | ORAL_CAPSULE | ORAL | Status: DC

## 2015-09-08 NOTE — Telephone Encounter (Signed)
Please let her know her Hep C test was negative.  Her TSH and Vit D are in normal ranges.  She will continue her synthroid and Rx was done when she was in the office.  She can continue the vit d at [facility] every two weeks and rx was sent to pharmacy as well or she can switch to 2000 IU of OTC Vit D daily.  Either is fine.  Thanks.

## 2015-09-08 NOTE — Telephone Encounter (Signed)
Routing to [place] for review and advise of labs from 08/28/2015.

## 2015-09-08 NOTE — Telephone Encounter (Signed)
Spoke with patient. Advised of message as seen below from [place]. She is agreeable and verbalizes understanding. She would like to start on OTC Vitamin D 2000 IU daily instead of taking rx due to cost.  Routing to provider for final review. Patient agreeable to disposition. Will close encounter.

## 2015-09-08 NOTE — Telephone Encounter (Signed)
Patient calling nurse to find out what Dr.Miller want her to change with her vitamin D medication and thyroid.

## 2015-10-14 ENCOUNTER — Encounter: Payer: Self-pay | Admitting: Adult Health

## 2015-10-14 ENCOUNTER — Ambulatory Visit (INDEPENDENT_AMBULATORY_CARE_PROVIDER_SITE_OTHER): Payer: BC Managed Care – PPO | Admitting: Adult Health

## 2015-10-14 VITALS — BP 132/84 | Temp 98.1°F | Wt 159.9 lb

## 2015-10-14 DIAGNOSIS — L259 Unspecified contact dermatitis, unspecified cause: Secondary | ICD-10-CM | POA: Diagnosis not present

## 2015-10-14 MED ORDER — METHYLPREDNISOLONE 4 MG PO TBPK: ORAL_TABLET | ORAL | Status: DC

## 2015-10-14 MED ORDER — METHYLPREDNISOLONE ACETATE 80 MG/ML IJ SUSP
120.0000 mg | Freq: Once | INTRAMUSCULAR | Status: AC
  Administered 2015-10-14: 120 mg via INTRAMUSCULAR

## 2015-10-14 NOTE — Progress Notes (Signed)
   Subjective:    Patient ID: Sabrina Gibbs, female    DOB: 1955-07-18, 60 y.o.   MRN: [id number]  HPI  60 year old female who presents to the office today for possible poison ivy exposure. She reports that she was out side in a wooded area four days ago, soon after she noticed a weeping pruritic rash involving the left side of her neck, upper back and abdomen.   She has been applying calamine lotion with minimal relief.  Review of Systems  Constitutional: Negative.   Skin: Positive for color change and rash.   Past Medical History  Diagnosis Date  . Thyroid disease   . Fibroid   . Colon polyp, hyperplastic 2008    Social History   Social History  . Marital Status: Married    Spouse Name: N/A  . Number of Children: N/A  . Years of Education: N/A   Occupational History  . Not on file.   Social History Main Topics  . Smoking status: Never Smoker   . Smokeless tobacco: Never Used  . Alcohol Use: 8.4 oz/week    14 Glasses of wine per week  . Drug Use: No  . Sexual Activity:    Partners: Male   Other Topics Concern  . Not on file   Social History Narrative    Past Surgical History  Procedure Laterality Date  . Knee arthroscopy Left 1999    Family History  Problem Relation Age of Onset  . Heart disease Father   . Hypertension Father   . Fibromyalgia Brother   . Heart disease Mother   . Atrial fibrillation Mother     No Known Allergies  Current Outpatient Prescriptions on File Prior to Visit  Medication Sig Dispense Refill  . levothyroxine (SYNTHROID, LEVOTHROID) 50 MCG tablet Take 1 tablet (50 mcg total) by mouth daily. 90 tablet 4   No current facility-administered medications on file prior to visit.    BP 132/84 mmHg  Temp(Src) 98.1 F (36.7 C) (Oral)  Wt 159 lb 14.4 oz (72.53 kg)  LMP 10/19/2010       Objective:   Physical Exam  Constitutional: She is oriented to person, place, and time. She appears well-developed and well-nourished. No  distress.  Neurological: She is alert and oriented to person, place, and time.  Skin: Skin is warm and dry. Rash noted. She is not diaphoretic. There is erythema. No pallor.  Red pruritic rash. No weeping currently.   Psychiatric: She has a normal mood and affect. Her behavior is normal. Judgment and thought content normal.  Nursing note and vitals reviewed.     Assessment & Plan:  1. Contact dermatitis - Poison Ivy like rash  - Continue with calamine lotion - methylPREDNISolone acetate (DEPO-MEDROL) injection 120 mg; Inject 1.5 mLs (120 mg total) into the muscle once. - methylPREDNISolone (MEDROL DOSEPAK) 4 MG TBPK  tablet; Take as directed  Dispense: 21 tablet; Refill: 0- If needed  - Follow up if no improvement.   Dorothyann Peng, NP

## 2015-10-14 NOTE — Patient Instructions (Addendum)
Continue with calamine lotion   I have sent in a prescription for oral prednisone incase you need it.   Follow up if no improvement.   Poison [employer] ivy is a inflammation of the skin (contact dermatitis) caused by touching the allergens on the leaves of the ivy plant following previous exposure to the plant. The rash usually appears 48 hours after exposure. The rash is usually bumps (papules) or blisters (vesicles) in a linear pattern. Depending on your own sensitivity, the rash may simply cause redness and itching, or it may also progress to blisters which may break open. These must be well cared for to prevent secondary bacterial (germ) infection, followed by scarring. Keep any open areas dry, clean, dressed, and covered with an antibacterial ointment if needed. The eyes may also get puffy. The puffiness is worst in the morning and gets better as the day progresses. This dermatitis usually heals without scarring, within 2 to 3 weeks without treatment. HOME CARE INSTRUCTIONS  Thoroughly wash with soap and water as soon as you have been exposed to poison ivy. You have about one half hour to remove the plant resin before it will cause the rash. This washing will destroy the oil or antigen on the skin that is causing, or will cause, the rash. Be sure to wash under your fingernails as any plant resin there will continue to spread the rash. Do not rub skin vigorously when washing affected area. Poison ivy cannot spread if no oil from the plant remains on your body. A rash that has progressed to weeping sores will not spread the rash unless you have not washed thoroughly. It is also important to wash any clothes you have been wearing as these may carry active allergens. The rash will return if you wear the unwashed clothing, even several days later. Avoidance of the plant in the future is the best measure. Poison ivy plant can be recognized by the number of leaves. Generally, poison ivy has three leaves  with flowering branches on a single stem. Diphenhydramine may be purchased over the counter and used as needed for itching. Do not drive with this medication if it makes you drowsy.Ask your caregiver about medication for children. SEEK MEDICAL CARE IF:  Open sores develop.  Redness spreads beyond area of rash.  You notice purulent (pus-like) discharge.  You have increased pain.  Other signs of infection develop (such as fever).   This information is not intended to replace advice given to you by your health care provider. Make sure you discuss any questions you have with your health care provider.   Document Released: 06/03/2000 Document Revised: 08/29/2011 Document Reviewed: 11/12/2014 Elsevier Interactive Patient Education [employer].

## 2015-10-30 ENCOUNTER — Telehealth: Payer: Self-pay | Admitting: Internal Medicine

## 2015-10-30 DIAGNOSIS — L259 Unspecified contact dermatitis, unspecified cause: Secondary | ICD-10-CM

## 2015-10-30 MED ORDER — METHYLPREDNISOLONE 4 MG PO TBPK: ORAL_TABLET | ORAL | Status: DC

## 2015-10-30 NOTE — Telephone Encounter (Signed)
Please call in a new prescription for a second Medrol 4 mg Dosepak

## 2015-10-30 NOTE — Telephone Encounter (Signed)
Please see message and advise. Pt saw Tommi Rumps on 4/26 was given a Depo shot and Prednisone pack.

## 2015-10-30 NOTE — Telephone Encounter (Signed)
Spoke to pt, told her Dr.K is going to give her Medrol dosepak again. Rx sent to pharmacy. If symptoms does not improve need to make an appt. Pt verbalized understanding.

## 2015-10-30 NOTE — Telephone Encounter (Signed)
Pts poison ivy has not gotten any better from visit and she would like to know if she could get a Rx for it.

## 2015-10-30 NOTE — Telephone Encounter (Signed)
Pt would like to know if she should make a appointment.

## 2016-03-02 ENCOUNTER — Telehealth: Payer: Self-pay | Admitting: Internal Medicine

## 2016-03-02 DIAGNOSIS — L259 Unspecified contact dermatitis, unspecified cause: Secondary | ICD-10-CM

## 2016-03-02 NOTE — Telephone Encounter (Signed)
Pt would like a rx for prednisone. Pt states she gets poison ivy "just by looking at it" and Dr Raliegh Ip is aware. Pt is going to the [facility] in [place] on vacation and would like to take this rx with her.  [place]

## 2016-03-02 NOTE — Telephone Encounter (Signed)
Okay, prednisone Dosepak 5 mg 6 day

## 2016-03-02 NOTE — Telephone Encounter (Signed)
Please see message and advise 

## 2016-03-04 MED ORDER — METHYLPREDNISOLONE 4 MG PO TBPK: ORAL_TABLET | ORAL | 0 refills | Status: DC

## 2016-03-04 NOTE — Telephone Encounter (Signed)
Left message on voicemail Rx sent to pharmacy as requested. 

## 2016-03-04 NOTE — Telephone Encounter (Signed)
Pt is calling to check the status of Rx b/c she will be leaving to go out of town late Monday or Tuesday.

## 2016-03-04 NOTE — Telephone Encounter (Signed)
Discussed Rx with Dr.K asked if okay to order Medrol Dosepak 4 mg like pt had before. Dr. Raliegh Ip said that was fine. Rx sent to pharmacy.

## 2016-04-01 ENCOUNTER — Other Ambulatory Visit: Payer: Self-pay | Admitting: Obstetrics & Gynecology

## 2016-04-01 DIAGNOSIS — Z1231 Encounter for screening mammogram for malignant neoplasm of breast: Secondary | ICD-10-CM

## 2016-04-19 ENCOUNTER — Ambulatory Visit
Admission: RE | Admit: 2016-04-19 | Discharge: 2016-04-19 | Disposition: A | Discharge status: Home / Self Care | Payer: BC Managed Care – PPO | Source: Ambulatory Visit | Attending: Obstetrics & Gynecology | Admitting: Obstetrics & Gynecology

## 2016-04-19 DIAGNOSIS — Z1231 Encounter for screening mammogram for malignant neoplasm of breast: Secondary | ICD-10-CM

## 2016-05-31 ENCOUNTER — Ambulatory Visit (INDEPENDENT_AMBULATORY_CARE_PROVIDER_SITE_OTHER): Payer: BC Managed Care – PPO | Admitting: Family Medicine

## 2016-05-31 ENCOUNTER — Encounter: Payer: Self-pay | Admitting: Family Medicine

## 2016-05-31 VITALS — BP 124/70 | HR 69 | Temp 97.9°F | Ht 64.5 in | Wt 147.8 lb

## 2016-05-31 DIAGNOSIS — H1031 Unspecified acute conjunctivitis, right eye: Secondary | ICD-10-CM | POA: Diagnosis not present

## 2016-05-31 MED ORDER — ERYTHROMYCIN 5 MG/GM OP OINT: 1.0000 "application " | TOPICAL_OINTMENT | Freq: Every day | OPHTHALMIC | 0 refills | Status: DC

## 2016-05-31 NOTE — Patient Instructions (Signed)
Compresses   Take zyrtec daily  Use the ointment at night  Seek medical care promptly if worsening, new symptoms or is not responding to treatment.

## 2016-05-31 NOTE — Progress Notes (Signed)
Pre visit review using our clinic review tool, if applicable. No additional management support is needed unless otherwise documented below in the visit note. 

## 2016-05-31 NOTE — Progress Notes (Signed)
  HPI:  Acute visit for:  R eye irritation: -started 3 days ago -itchy R eye, minimal clear drainage, some swelling lids -no ha, vision changes, eye pain, known trauma, fevers -no contacts  ROS: See pertinent positives and negatives per HPI.  Past Medical History:  Diagnosis Date  . Colon polyp, hyperplastic 2008  . Fibroid   . Thyroid disease     Past Surgical History:  Procedure Laterality Date  . KNEE ARTHROSCOPY Left 1999    Family History  Problem Relation Age of Onset  . Heart disease Father   . Hypertension Father   . Fibromyalgia Brother   . Heart disease Mother   . Atrial fibrillation Mother     Social History   Social History  . Marital status: Married    Spouse name: N/A  . Number of children: N/A  . Years of education: N/A   Social History Main Topics  . Smoking status: Never Smoker  . Smokeless tobacco: Never Used  . Alcohol use 8.4 oz/week    14 Glasses of wine per week  . Drug use: No  . Sexual activity: Yes    Partners: Male   Other Topics Concern  . None   Social History Narrative  . None     Current Outpatient Prescriptions:  .  levothyroxine (SYNTHROID, LEVOTHROID) 50 MCG tablet, Take 1 tablet (50 mcg total) by mouth daily., Disp: 90 tablet, Rfl: 4 .  erythromycin ophthalmic ointment, Place 1 application into the left eye at bedtime., Disp: 3.5 g, Rfl: 0  EXAM:  Vitals:   05/31/16 1632  BP: 124/70  Pulse: 69  Temp: 97.9 F (36.6 C)    Body mass index is 24.98 kg/m.  GENERAL: vitals reviewed and listed above, alert, oriented, appears well hydrated and in no acute distress  HEENT: atraumatic, conjunctival erythema R, minimal clear discharge R, mild lid edema, no warmth or induration of skin around eye, no signs corneal trauma or foreign body, EOMI, visual acuity grossly intact, no obvious abnormalities on inspection of external nose and ears  NECK: no obvious masses on inspection  MS: moves all extremities without  noticeable abnormality  PSYCH: pleasant and cooperative, no obvious depression or anxiety  ASSESSMENT AND PLAN:  Discussed the following assessment and plan:  Acute conjunctivitis of right eye, unspecified acute conjunctivitis type -suspect more likely allergic given appearance and symptoms -opted to treat with antihistamine, compresses, abx ointment if any thick discharge or not improving -Patient advised to return or notify a doctor immediately if symptoms worsen or persist or new concerns arise.  Patient Instructions  Compresses   Take zyrtec daily  Use the ointment at night  Seek medical care promptly if worsening, new symptoms or is not responding to treatment.   Colin Benton R., DO

## 2016-07-01 ENCOUNTER — Telehealth: Payer: Self-pay | Admitting: Internal Medicine

## 2016-07-01 MED ORDER — OSELTAMIVIR PHOSPHATE 75 MG PO CAPS: 75.0000 mg | ORAL_CAPSULE | Freq: Two times a day (BID) | ORAL | 0 refills | Status: DC

## 2016-07-01 NOTE — Telephone Encounter (Signed)
Pt traveling to [place], will be gone a month. Would like a rx for Tamiflu to take with him just in case.  [place] [employer]

## 2016-07-01 NOTE — Telephone Encounter (Signed)
Tamiflu 75 mg #10 was called in to [facility]. Pt is to take one tablet twice daily for flu symptoms.  Spoke to pt and informed. Pt verbalized understanding.

## 2016-07-01 NOTE — Telephone Encounter (Signed)
Okay.  Tamiflu 75 mg #10 one twice a day as directed

## 2016-07-01 NOTE — Telephone Encounter (Signed)
See message below. Please advsie

## 2016-09-26 ENCOUNTER — Other Ambulatory Visit: Payer: Self-pay | Admitting: Obstetrics & Gynecology

## 2016-09-26 NOTE — Telephone Encounter (Signed)
Medication refill request: levothyroxine  Last AEX:  08-28-15  Next AEX: 12-06-16  Last MMG (if hormonal medication request): 04-19-16 WNL  Refill authorized: please advise

## 2016-12-06 ENCOUNTER — Encounter: Payer: Self-pay | Admitting: Obstetrics & Gynecology

## 2016-12-06 ENCOUNTER — Other Ambulatory Visit (HOSPITAL_COMMUNITY)
Admission: RE | Admit: 2016-12-06 | Discharge: 2016-12-06 | Disposition: A | Discharge status: Home / Self Care | Payer: BC Managed Care – PPO | Source: Ambulatory Visit | Attending: Obstetrics & Gynecology | Admitting: Obstetrics & Gynecology

## 2016-12-06 ENCOUNTER — Ambulatory Visit (INDEPENDENT_AMBULATORY_CARE_PROVIDER_SITE_OTHER): Payer: BC Managed Care – PPO | Admitting: Obstetrics & Gynecology

## 2016-12-06 ENCOUNTER — Ambulatory Visit: Payer: BC Managed Care – PPO | Admitting: Obstetrics & Gynecology

## 2016-12-06 VITALS — BP 124/80 | HR 68 | Resp 16 | Ht 64.25 in | Wt 138.6 lb

## 2016-12-06 DIAGNOSIS — Z0000 Encounter for general adult medical examination without abnormal findings: Secondary | ICD-10-CM

## 2016-12-06 DIAGNOSIS — Z124 Encounter for screening for malignant neoplasm of cervix: Secondary | ICD-10-CM

## 2016-12-06 DIAGNOSIS — Z01419 Encounter for gynecological examination (general) (routine) without abnormal findings: Secondary | ICD-10-CM | POA: Diagnosis not present

## 2016-12-06 DIAGNOSIS — E2839 Other primary ovarian failure: Secondary | ICD-10-CM | POA: Diagnosis not present

## 2016-12-06 MED ORDER — LEVOTHYROXINE SODIUM 50 MCG PO TABS: 50.0000 ug | ORAL_TABLET | Freq: Every day | ORAL | 4 refills | Status: DC

## 2016-12-06 NOTE — Patient Instructions (Signed)
Plan to do a bone density test with your mammogram in November.

## 2016-12-06 NOTE — Progress Notes (Signed)
61 y.o. G0P0000 MarriedCaucasianF here for annual exam.  Doing well.  Denies vaginal bleeding.     Patient's last menstrual period was 10/19/2010.          Sexually active: Yes.    The current method of family planning is post menopausal status.    Exercising: Yes.    Walking, biking Smoker:  no  Health Maintenance: Pap:  06/06/14 Neg. 04/23/12 Neg. HR HPV:neg History of abnormal Pap:  no MMG:  04/19/16 BIRADS1, [facility] Colonoscopy:  04/2007 Normal - repeat 10 years  BMD:   Never TDaP:  02/2011  Hep C: 08/28/15 Neg Shingles vaccine:  Discussed with pt today Screening Labs: wants labs done today.    reports that she has never smoked. She has never used smokeless tobacco. She reports that she drinks about 6.0 oz of alcohol per week . She reports that she does not use drugs.  Past Medical History:  Diagnosis Date  . Colon polyp, hyperplastic 2008  . Fibroid   . Thyroid disease     Past Surgical History:  Procedure Laterality Date  . KNEE ARTHROSCOPY Left 1999    Current Outpatient Prescriptions  Medication Sig Dispense Refill  . cholecalciferol (VITAMIN D) 1000 units tablet Take 1,000 Units by mouth daily.    Marland Kitchen levothyroxine (SYNTHROID, LEVOTHROID) 50 MCG tablet TAKE 1 TABLET (50 MCG TOTAL) BY MOUTH DAILY. 90 tablet 0   No current facility-administered medications for this visit.     Family History  Problem Relation Age of Onset  . Heart disease Father   . Hypertension Father   . Fibromyalgia Brother   . Heart disease Mother   . Atrial fibrillation Mother     ROS:  Pertinent items are noted in HPI.  Otherwise, a comprehensive ROS was negative.  Exam:   BP 124/80 (BP Location: Right Arm, Patient Position: Sitting, Cuff Size: Normal)   Pulse 68   Resp 16   Ht 5' 4.25" (1.632 m)   Wt 138 lb 9.6 oz (62.9 kg)   LMP 10/19/2010   BMI 23.61 kg/m    Height: 5' 4.25" (163.2 cm)  Ht Readings from Last 3 Encounters:  12/06/16 5' 4.25" (1.632 m)  05/31/16  5' 4.5" (1.638 m)  08/28/15 5' 4.5" (1.638 m)    General appearance: alert, cooperative and appears stated age Head: Normocephalic, without obvious abnormality, atraumatic Neck: no adenopathy, supple, symmetrical, trachea midline and thyroid normal to inspection and palpation Lungs: clear to auscultation bilaterally Breasts: normal appearance, no masses or tenderness Heart: regular rate and rhythm Abdomen: soft, non-tender; bowel sounds normal; no masses,  no organomegaly Extremities: extremities normal, atraumatic, no cyanosis or edema Skin: Skin color, texture, turgor normal. No rashes or lesions Lymph nodes: Cervical, supraclavicular, and axillary nodes normal. No abnormal inguinal nodes palpated Neurologic: Grossly normal   Pelvic: External genitalia:  no lesions              Urethra:  normal appearing urethra with no masses, tenderness or lesions              Bartholins and Skenes: normal                 Vagina: normal appearing vagina with normal color and discharge, no lesions              Cervix: no lesions              Pap taken: Yes.   Bimanual Exam:  Uterus:  Enlarged and nodular, about 8-10 weeks size, stable              Adnexa: normal adnexa               Rectovaginal: Confirms               Anus:  normal sphincter tone, no lesions  Chaperone was present for exam.  A:  Well Woman with normal exam PMP, no HRT H/O uterine fibroids Hypothyroidism H/O enlarged thyroid with ultrasound 12/15 with small <1cm nodule  P:   Mammogram guidelines reviewed pap smear with HR HPV obtained today shingrix vaccination order given BMD with MMG this fall Colonoscopy due this fall.  Pt aware and will call if needs help with referral TSH, Vit D, Lipids, CMP, CBC obtained today Return annually or prn

## 2016-12-07 LAB — COMPREHENSIVE METABOLIC PANEL
A/G RATIO: 2.1 (ref 1.2–2.2)
ALT: 16 IU/L (ref 0–32)
AST: 28 IU/L (ref 0–40)
Albumin: 4.8 g/dL (ref 3.6–4.8)
Alkaline Phosphatase: 83 IU/L (ref 39–117)
BUN/Creatinine Ratio: 17 (ref 12–28)
BUN: 12 mg/dL (ref 8–27)
Bilirubin Total: 0.4 mg/dL (ref 0.0–1.2)
CALCIUM: 9.4 mg/dL (ref 8.7–10.3)
CO2: 25 mmol/L (ref 20–29)
Chloride: 99 mmol/L (ref 96–106)
Creatinine, Ser: 0.7 mg/dL (ref 0.57–1.00)
GFR calc Af Amer: 108 mL/min/{1.73_m2} (ref >59)
GFR, EST NON AFRICAN AMERICAN: 94 mL/min/{1.73_m2} (ref >59)
GLOBULIN, TOTAL: 2.3 g/dL (ref 1.5–4.5)
Glucose: 84 mg/dL (ref 65–99)
POTASSIUM: 4.1 mmol/L (ref 3.5–5.2)
Sodium: 140 mmol/L (ref 134–144)
TOTAL PROTEIN: 7.1 g/dL (ref 6.0–8.5)

## 2016-12-07 LAB — CBC
Hematocrit: 40.9 % (ref 34.0–46.6)
Hemoglobin: 13.6 g/dL (ref 11.1–15.9)
MCH: 30.8 pg (ref 26.6–33.0)
MCHC: 33.3 g/dL (ref 31.5–35.7)
MCV: 93 fL (ref 79–97)
Platelets: 245 10*3/uL (ref 150–379)
RBC: 4.41 x10E6/uL (ref 3.77–5.28)
RDW: 13.5 % (ref 12.3–15.4)
WBC: 7.3 10*3/uL (ref 3.4–10.8)

## 2016-12-07 LAB — LIPID PANEL
CHOL/HDL RATIO: 2.3 ratio (ref 0.0–4.4)
Cholesterol, Total: 201 mg/dL — ABNORMAL HIGH (ref 100–199)
HDL: 86 mg/dL (ref >39)
LDL Calculated: 92 mg/dL (ref 0–99)
Triglycerides: 114 mg/dL (ref 0–149)
VLDL Cholesterol Cal: 23 mg/dL (ref 5–40)

## 2016-12-07 LAB — VITAMIN D 25 HYDROXY (VIT D DEFICIENCY, FRACTURES): Vit D, 25-Hydroxy: 80.8 ng/mL (ref 30.0–100.0)

## 2016-12-07 LAB — TSH: TSH: 1.95 u[IU]/mL (ref 0.450–4.500)

## 2016-12-08 LAB — CYTOLOGY - PAP
Diagnosis: NEGATIVE
HPV: NOT DETECTED

## 2016-12-29 ENCOUNTER — Encounter: Payer: Self-pay | Admitting: Family Medicine

## 2016-12-29 ENCOUNTER — Ambulatory Visit (INDEPENDENT_AMBULATORY_CARE_PROVIDER_SITE_OTHER): Payer: BC Managed Care – PPO | Admitting: Family Medicine

## 2016-12-29 VITALS — BP 118/78 | HR 78 | Temp 98.3°F | Wt 142.0 lb

## 2016-12-29 DIAGNOSIS — L237 Allergic contact dermatitis due to plants, except food: Secondary | ICD-10-CM

## 2016-12-29 MED ORDER — TRIAMCINOLONE ACETONIDE 0.025 % EX OINT: 1.0000 "application " | TOPICAL_OINTMENT | Freq: Two times a day (BID) | CUTANEOUS | 0 refills | Status: DC

## 2016-12-29 MED ORDER — PREDNISONE 10 MG PO TABS: ORAL_TABLET | ORAL | 0 refills | Status: DC

## 2016-12-29 NOTE — Patient Instructions (Signed)
It was a pleasure to see you today. Please take medication with food as directed and remember to use sunscreen and avoid sun when taking this medication.   Poison Ivy Dermatitis Poison ivy dermatitis is redness and soreness (inflammation) of the skin. It is caused by a chemical that is found on the leaves of the poison ivy plant. You may also have itching, a rash, and blisters. Symptoms often clear up in 1-2 weeks. You may get this condition by touching a poison ivy plant. You can also get it by touching something that has the chemical on it. This may include animals or objects that have come in contact with the plant. Follow these instructions at home: General instructions  Take or apply over-the-counter and prescription medicines only as told by your doctor.  If you touch poison ivy, wash your skin with soap and cold water right away.  Use hydrocortisone creams or calamine lotion as needed to help with itching.  Take oatmeal baths as needed. Use colloidal oatmeal. You can get this at a pharmacy or grocery store. Follow the instructions on the package.  Do not scratch or rub your skin.  While you have the rash, wash your clothes right after you wear them. Prevention  Know what poison ivy looks like so you can avoid it. This plant has three leaves with flowering branches on a single stem. The leaves are glossy. They have uneven edges that come to a point at the front.  If you have touched poison ivy, wash with soap and water right away. Be sure to wash under your fingernails.  When hiking or camping, wear long pants, a long-sleeved shirt, tall socks, and hiking boots. You can also use a lotion on your skin that helps to prevent contact with the chemical on the plant.  If you think that your clothes or outdoor gear came in contact with poison ivy, rinse them off with a garden hose before you bring them inside your house. Contact a doctor if:  You have open sores in the rash area.  You  have more redness, swelling, or pain in the affected area.  You have redness that spreads beyond the rash area.  You have fluid, blood, or pus coming from the affected area.  You have a fever.  You have a rash over a large area of your body.  You have a rash on your eyes, mouth, or genitals.  Your rash does not get better after a few days. Get help right away if:  Your face swells or your eyes swell shut.  You have trouble breathing.  You have trouble swallowing. This information is not intended to replace advice given to you by your health care provider. Make sure you discuss any questions you have with your health care provider. Document Released: 07/09/2010 Document Revised: 11/12/2015 Document Reviewed: 11/12/2014 Elsevier Interactive Patient Education  [employer].

## 2016-12-29 NOTE — Progress Notes (Signed)
Patient ID: Keondra Haydu, female   DOB: 09-07-1955, 61 y.o.   MRN: [id number]   PCP: Marletta Lor, MD  Subjective:  Arlyss Weathersby is a 61 y.o. year old very pleasant female patient who presents with a Rash: Initial distribution: right forearm, left forearm Prior history of this rash: Yes Associated symptoms:  Pruritus, pain Change in rash: blister like and weeping   Denies:  Fever, chills, sweats, myalgias, difficulty swallowing, hoarseness, SOB, N/V Abdominal pain, or tightening throat. No new exposures of soaps, lotions, laundry detergent, fabric softeners, foods, medications, herbal supplements, STDs,  animals, or insects.  -started: one week ago, symptoms are not improving  -previous treatments: OTC calamine lotion which provided limited benefit -sick contacts/travel/risks: denies flu exposure.  -Hx of: allergies  ROS-denies fever, SOB, NVD,   Pertinent Past Medical History- Hypothyroidism  Medications- reviewed  Current Outpatient Prescriptions  Medication Sig Dispense Refill  . cholecalciferol (VITAMIN D) 1000 units tablet Take 1,000 Units by mouth daily.    Marland Kitchen levothyroxine (SYNTHROID, LEVOTHROID) 50 MCG tablet Take 1 tablet (50 mcg total) by mouth daily. 90 tablet 4   No current facility-administered medications for this visit.     Objective: BP 118/78 (BP Location: Left Arm, Patient Position: Sitting, Cuff Size: Normal)   Pulse 78   Temp 98.3 F (36.8 C) (Oral)   Wt 142 lb (64.4 kg)   LMP 10/19/2010   SpO2 98%   BMI 24.18 kg/m  Gen: NAD, resting comfortably HEENT: Oropharynx is clear and moist CV: RRR no murmurs rubs or gallops Lungs: CTAB no crackles, wheeze, rhonchi Abdomen: soft/nontender/nondistended/normal bowel sounds. No rebound or guarding.  Ext: no edema Skin: warm, dry, erythematous papules in a linear fashion on right forearm. Two erythematous papules on left forearm and chest. Pruritic and no weeping present. Neuro: grossly normal,  moves all extremities  Assessment/Plan: 1. Contact dermatitis due to poison ivy Poison Ivy rash, oral prednisone and triamcinolone. Provided written instructions regarding rash and avoidance of rash. Further advised patient that prednisone is photosensitive and she should avoid sun and wear sunscreen when taking this medication. - predniSONE (DELTASONE) 10 MG tablet; Take 4 tablets daily for 4 days, 3 tabs daily for 2 days, 2 tabs daily for 2 days, and one tab daily for 2 days.  Dispense: 28 tablet; Refill: 0 - triamcinolone (KENALOG) 0.025 % ointment; Apply 1 application topically 2 (two) times daily.  Dispense: 30 g; Refill: 0   Finally, we reviewed reasons to return to care including if symptoms worsen or persist or new concerns arise- once again particularly rash that does not improve,  shortness of breath, N/V, or fever.  Reviewed importance of calling 911immediately if symptoms of SOB, difficulty swallowing, or throat tightening.   Laurita Quint, FNP

## 2017-03-09 ENCOUNTER — Encounter: Payer: Self-pay | Admitting: Internal Medicine

## 2017-05-22 ENCOUNTER — Other Ambulatory Visit: Payer: Self-pay | Admitting: Obstetrics & Gynecology

## 2017-05-22 DIAGNOSIS — Z1231 Encounter for screening mammogram for malignant neoplasm of breast: Secondary | ICD-10-CM

## 2017-06-28 ENCOUNTER — Ambulatory Visit: Payer: BC Managed Care – PPO

## 2017-06-28 ENCOUNTER — Other Ambulatory Visit: Payer: BC Managed Care – PPO

## 2017-07-05 ENCOUNTER — Ambulatory Visit
Admission: RE | Admit: 2017-07-05 | Discharge: 2017-07-05 | Disposition: A | Discharge status: Home / Self Care | Payer: BC Managed Care – PPO | Source: Ambulatory Visit | Attending: Obstetrics & Gynecology | Admitting: Obstetrics & Gynecology

## 2017-07-05 DIAGNOSIS — E2839 Other primary ovarian failure: Secondary | ICD-10-CM

## 2017-07-05 DIAGNOSIS — Z1231 Encounter for screening mammogram for malignant neoplasm of breast: Secondary | ICD-10-CM
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
## 2018-01-19 ENCOUNTER — Encounter: Payer: Self-pay | Admitting: Internal Medicine

## 2018-01-19 ENCOUNTER — Ambulatory Visit: Payer: BC Managed Care – PPO | Admitting: Internal Medicine

## 2018-01-19 VITALS — BP 128/88 | HR 71 | Temp 98.2°F | Wt 139.8 lb

## 2018-01-19 DIAGNOSIS — H1032 Unspecified acute conjunctivitis, left eye: Secondary | ICD-10-CM

## 2018-01-19 MED ORDER — POLYMYXIN B-TRIMETHOPRIM 10000-0.1 UNIT/ML-% OP SOLN: 1.0000 [drp] | OPHTHALMIC | 0 refills | Status: DC

## 2018-01-19 NOTE — Patient Instructions (Signed)
At this time   Continue   As you are doing and no contacts until better  If getting   Crusting  And infected looking dc then add antibiotic drop.    Fu with  [place] or eye doc if  Concerning sx    Issues  And not getting better

## 2018-01-21 ENCOUNTER — Encounter: Payer: Self-pay | Admitting: Obstetrics & Gynecology

## 2018-02-12 ENCOUNTER — Encounter: Payer: Self-pay | Admitting: Obstetrics & Gynecology

## 2018-02-12 ENCOUNTER — Other Ambulatory Visit: Payer: Self-pay

## 2018-02-12 ENCOUNTER — Encounter

## 2018-02-12 ENCOUNTER — Ambulatory Visit (INDEPENDENT_AMBULATORY_CARE_PROVIDER_SITE_OTHER): Payer: BC Managed Care – PPO | Admitting: Obstetrics & Gynecology

## 2018-02-12 VITALS — BP 132/80 | HR 68 | Resp 14 | Ht 64.0 in | Wt 141.4 lb

## 2018-02-12 DIAGNOSIS — Z1211 Encounter for screening for malignant neoplasm of colon: Secondary | ICD-10-CM

## 2018-02-12 DIAGNOSIS — E031 Congenital hypothyroidism without goiter: Secondary | ICD-10-CM

## 2018-02-12 DIAGNOSIS — Z01419 Encounter for gynecological examination (general) (routine) without abnormal findings: Secondary | ICD-10-CM

## 2018-02-12 MED ORDER — LEVOTHYROXINE SODIUM 50 MCG PO TABS: 50.0000 ug | ORAL_TABLET | Freq: Every day | ORAL | 4 refills | Status: DC

## 2018-02-12 NOTE — Patient Instructions (Signed)
Outpatient Pharmacy Phone: [phone] ([phone] Location: Lower level of [facility] ([address]) Hours: 7:30 a.m. to 6:00 p.m., Monday through Friday.   [facility] Phone: [phone] Location: [address] Hours: 7:30 a.m. to 6:00 p.m., Monday through Friday.  

## 2018-02-12 NOTE — Progress Notes (Signed)
62 y.o. G0P0000 MarriedCaucasianF here for annual exam.  Husband diagnosed with prostate cancer this year.  Just finished radiation.    She is heading to the beach at the end of the week.     Denies vaginal bleeding.    Patient's last menstrual period was 10/19/2010.          Sexually active: Yes.    The current method of family planning is post menopausal status.    Exercising: Yes.    walk, bike Smoker:  no  Health Maintenance: Pap:  12/06/16 Neg. HR HPV:neg   06/06/14 neg  History of abnormal Pap:  no MMG:  07/05/17 BIRADS1:neg  Colonoscopy:  04/2007 Normal. Due this yea. BMD:   07/05/17 Normal  TDaP:  2012 Pneumonia vaccine(s):  no Shingrix:   no Hep C testing: 08/28/15 neg  Screening Labs: up to date from 2018   reports that she has never smoked. She has never used smokeless tobacco. She reports that she drinks about 10.0 standard drinks of alcohol per week. She reports that she does not use drugs.  Past Medical History:  Diagnosis Date  . Colon polyp, hyperplastic 2008  . Fibroid   . Thyroid disease     Past Surgical History:  Procedure Laterality Date  . KNEE ARTHROSCOPY Left 1999    Current Outpatient Medications  Medication Sig Dispense Refill  . cholecalciferol (VITAMIN D) 1000 units tablet Take 1,000 Units by mouth daily.    Marland Kitchen levothyroxine (SYNTHROID, LEVOTHROID) 50 MCG tablet Take 1 tablet (50 mcg total) by mouth daily. 90 tablet 4   No current facility-administered medications for this visit.     Family History  Problem Relation Age of Onset  . Fibromyalgia Brother   . Heart disease Father   . Hypertension Father   . Heart disease Mother   . Atrial fibrillation Mother     Review of Systems  All other systems reviewed and are negative.   Exam:   BP 132/80 (BP Location: Left Arm, Patient Position: Sitting, Cuff Size: Normal)   Pulse 68   Resp 14   Ht 5\' 4"  (1.626 m)   Wt 141 lb 6.4 oz (64.1 kg)   LMP 10/19/2010   BMI 24.27 kg/m    Height: 5'  4" (162.6 cm)  Ht Readings from Last 3 Encounters:  02/12/18 5\' 4"  (1.626 m)  12/06/16 5' 4.25" (1.632 m)  05/31/16 5' 4.5" (1.638 m)    General appearance: alert, cooperative and appears stated age Head: Normocephalic, without obvious abnormality, atraumatic Neck: no adenopathy, supple, symmetrical, trachea midline and thyroid normal to inspection and palpation Lungs: clear to auscultation bilaterally Breasts: normal appearance, no masses or tenderness Heart: regular rate and rhythm Abdomen: soft, non-tender; bowel sounds normal; no masses,  no organomegaly Extremities: extremities normal, atraumatic, no cyanosis or edema Skin: Skin color, texture, turgor normal. No rashes or lesions Lymph nodes: Cervical, supraclavicular, and axillary nodes normal. No abnormal inguinal nodes palpated Neurologic: Grossly normal   Pelvic: External genitalia:  no lesions              Urethra:  normal appearing urethra with no masses, tenderness or lesions              Bartholins and Skenes: normal                 Vagina: normal appearing vagina with normal color and discharge, no lesions  Cervix: no lesions              Pap taken: No. Bimanual Exam:  Uterus:  enlarged, 10 weeks size globular and c/w fibroids, no changes, mobile              Adnexa: no mass, fullness, tenderness               Rectovaginal: Confirms               Anus:  normal sphincter tone, no lesions  Chaperone was present for exam.  A:  Well Woman with normal exam PMP, no HRT H/O uterine fibroids Hypothyroidism, h/o 71mm solid nodule on ultrasound 2015.  Follow up only recommended if had risks for cancer  P:   Mammogram guidelines reviewed pap smear not obtained.  Neg with neg HR HPV 2018. Colonoscopy referral made.  H/o hyperplastic polyps 2008 TSH obtained today RF on synthroid 68mcg daily to pharmacy . #90/4RF. return annually or prn

## 2018-02-13 LAB — TSH: TSH: 1.7 u[IU]/mL (ref 0.450–4.500)

## 2018-02-27 ENCOUNTER — Telehealth: Payer: Self-pay | Admitting: Obstetrics & Gynecology

## 2018-02-27 NOTE — Telephone Encounter (Signed)
Left voicemail regarding referral appointment. The information is listed below. Should the patient need to cancel or reschedule this appointment, Please advise them to call the office they've been referred to in order to reschedule.  [facility] [address] [place], [place]  Phone: [phone]  Dr. Collene Mares 03/13/18 @ 4:00 pm. Please arrive 15 minutes early and bring your insurance card and photo id and list of medications.

## 2018-04-19 ENCOUNTER — Telehealth: Payer: Self-pay

## 2018-04-19 NOTE — Telephone Encounter (Signed)
Copied from [place] [id number]. Topic: Appointment Scheduling - Scheduling Inquiry for Clinic >> Apr 18, 2018 10:34 AM Sabrina Gibbs wrote: Reason for CRM: Pt would like to know if she can be worked in sooner than her scheduled TOC appointment on 11/20 if possible. Added patient to the wait list already. Please advise.

## 2018-04-19 NOTE — Telephone Encounter (Signed)
Spoke with patient and went through Dr. Crecencio Mc schedule, 11/20 is the earliest appointment for TOC. Patient stated that they were going out of town and this was the need for rescheduling. Stated that her husband had an appointment on 12/18 and she could come in then. Patient has been rescheduled for 12/18 at 1:00

## 2018-05-09 ENCOUNTER — Encounter: Payer: BC Managed Care – PPO | Admitting: Family Medicine

## 2018-05-22 LAB — COLOGUARD: Cologuard: NEGATIVE

## 2018-06-01 ENCOUNTER — Encounter: Payer: Self-pay | Admitting: Family Medicine

## 2018-06-04 ENCOUNTER — Telehealth: Payer: Self-pay

## 2018-06-04 NOTE — Telephone Encounter (Signed)
received a fax from [employer] stating that the patients Cologuard came back negative.  Patient is aware.

## 2018-06-06 ENCOUNTER — Encounter: Payer: Self-pay | Admitting: Family Medicine

## 2018-06-06 ENCOUNTER — Encounter

## 2018-06-06 ENCOUNTER — Ambulatory Visit: Payer: BC Managed Care – PPO | Admitting: Family Medicine

## 2018-06-06 VITALS — BP 130/76 | HR 68 | Temp 97.3°F | Ht 64.0 in | Wt 141.2 lb

## 2018-06-06 DIAGNOSIS — Z13220 Encounter for screening for lipoid disorders: Secondary | ICD-10-CM

## 2018-06-06 DIAGNOSIS — R2231 Localized swelling, mass and lump, right upper limb: Secondary | ICD-10-CM

## 2018-06-06 DIAGNOSIS — R202 Paresthesia of skin: Secondary | ICD-10-CM | POA: Diagnosis not present

## 2018-06-06 DIAGNOSIS — E039 Hypothyroidism, unspecified: Secondary | ICD-10-CM | POA: Diagnosis not present

## 2018-06-06 DIAGNOSIS — L409 Psoriasis, unspecified: Secondary | ICD-10-CM | POA: Insufficient documentation

## 2018-06-06 DIAGNOSIS — J302 Other seasonal allergic rhinitis: Secondary | ICD-10-CM

## 2018-06-06 MED ORDER — FLUTICASONE PROPIONATE 50 MCG/ACT NA SUSP: 2.0000 | Freq: Every day | NASAL | 6 refills | Status: AC

## 2018-06-06 NOTE — Progress Notes (Signed)
Sabrina Gibbs DOB: Dec 12, 1955 Encounter date: 06/06/2018  This is a 62 y.o. female who presents to establish care. Chief Complaint  Patient presents with  . Transitions Of Care    dicuss flonase    History of present illness: Uses the flonase just when more outdoor exposures. Also very allergic to poison ivy - gets it just being in vicinity of poison ivy.    Saw GI last year with GERD symptoms. Also completed cologuard.   Energy level is good. Does exercise on regular basis.   Gets "tinglings". Notes when sitting still - just momentary. Has it nearly daily. Feels fine otherwise. Not more with exercise. More when relaxed. No numbness, no weakness. Not noticing at night. Falls asleep quickly at night. Has been going on for a year.   Follows with obgyn. Follows with derm. Past Medical History:  Diagnosis Date  . Allergy to poison ivy    carried prednisone with her when she has possibility of exposure.   . Colon polyp, hyperplastic 2008  . Fibroid   . Thyroid disease    Past Surgical History:  Procedure Laterality Date  . BASAL CELL CARCINOMA EXCISION     nose  . KNEE ARTHROSCOPY Left 1999   No Known Allergies Current Meds  Medication Sig  . cholecalciferol (VITAMIN D) 1000 units tablet Take 1,000 Units by mouth daily.  Marland Kitchen levothyroxine (SYNTHROID, LEVOTHROID) 50 MCG tablet Take 1 tablet (50 mcg total) by mouth daily.   Social History   Tobacco Use  . Smoking status: Never Smoker  . Smokeless tobacco: Never Used  Substance Use Topics  . Alcohol use: Yes    Alcohol/week: 10.0 standard drinks    Types: 10 Glasses of wine per week   Family History  Problem Relation Age of Onset  . High blood pressure Sister   . Other Brother        muscle disease  . Diabetes Brother   . Heart disease Father   . Hypertension Father   . Heart disease Mother   . Atrial fibrillation Mother      Review of Systems  Constitutional: Negative for chills, fatigue and fever.   Respiratory: Negative for cough, chest tightness, shortness of breath and wheezing.   Cardiovascular: Negative for chest pain, palpitations and leg swelling.  Musculoskeletal: Positive for arthralgias (occasional hip pain). Negative for back pain.  Neurological: Negative for tremors, weakness and numbness.    Objective:  BP 130/76   Pulse 68   Temp (!) 97.3 F (36.3 C) (Oral)   Ht 5\' 4"  (1.626 m)   Wt 141 lb 3.2 oz (64 kg)   LMP 10/19/2010   SpO2 99%   BMI 24.24 kg/m   Weight: 141 lb 3.2 oz (64 kg)   BP Readings from Last 3 Encounters:  06/06/18 130/76  02/12/18 132/80  01/19/18 128/88   Wt Readings from Last 3 Encounters:  06/06/18 141 lb 3.2 oz (64 kg)  02/12/18 141 lb 6.4 oz (64.1 kg)  01/19/18 139 lb 12.8 oz (63.4 kg)    Physical Exam Constitutional:      General: She is not in acute distress.    Appearance: She is well-developed.  Cardiovascular:     Rate and Rhythm: Normal rate and regular rhythm.     Heart sounds: Normal heart sounds. No murmur. No friction rub.     Comments: No lower extremity edema Pulmonary:     Effort: Pulmonary effort is normal. No respiratory distress.  Breath sounds: Normal breath sounds. No wheezing or rales.  Skin:    Comments: Base ring finger right hand small few mm nodule - slightly mobile, non-compressible.   Neurological:     Mental Status: She is alert and oriented to person, place, and time.  Psychiatric:        Behavior: Behavior normal.     Assessment/Plan:  1. Nodule of finger of right hand Xray today; suspect ganglion cyst; but if bony will refer to hand surgeon. Discussed options which include attempt at drainage or surgical removal if cystic.  - DG Hand Complete Right; Future  2. Hypothyroidism, unspecified type bloodwork in feb-march - TSH; Future  3. Tingling Intermittent and brief. Will recheck bloodwork in feb-march; sooner if worsening. Has been stable for a year without additional symptoms.  - CBC  with Differential/Platelet; Future - Comprehensive metabolic panel; Future - Vitamin B12; Future  4. Lipid screening - Lipid panel; Future  Return in about 1 year (around 06/07/2019), or labwork in february -march, for physical exam.  Micheline Rough, MD

## 2018-06-08 ENCOUNTER — Ambulatory Visit (INDEPENDENT_AMBULATORY_CARE_PROVIDER_SITE_OTHER): Payer: BC Managed Care – PPO

## 2018-06-08 DIAGNOSIS — R2231 Localized swelling, mass and lump, right upper limb: Secondary | ICD-10-CM

## 2018-06-08 DIAGNOSIS — G629 Polyneuropathy, unspecified: Secondary | ICD-10-CM

## 2018-06-08 NOTE — Progress Notes (Signed)
Left message for patient to call back. CRM created 

## 2018-07-11 ENCOUNTER — Ambulatory Visit: Payer: BC Managed Care – PPO | Admitting: Family Medicine

## 2018-07-11 ENCOUNTER — Ambulatory Visit (INDEPENDENT_AMBULATORY_CARE_PROVIDER_SITE_OTHER): Payer: BC Managed Care – PPO

## 2018-07-11 ENCOUNTER — Encounter: Payer: Self-pay | Admitting: Family Medicine

## 2018-07-11 VITALS — BP 122/64 | HR 63 | Temp 97.8°F | Wt 143.8 lb

## 2018-07-11 DIAGNOSIS — Z13220 Encounter for screening for lipoid disorders: Secondary | ICD-10-CM | POA: Diagnosis not present

## 2018-07-11 DIAGNOSIS — R202 Paresthesia of skin: Secondary | ICD-10-CM

## 2018-07-11 DIAGNOSIS — E039 Hypothyroidism, unspecified: Secondary | ICD-10-CM | POA: Diagnosis not present

## 2018-07-11 DIAGNOSIS — G629 Polyneuropathy, unspecified: Secondary | ICD-10-CM

## 2018-07-11 DIAGNOSIS — R0789 Other chest pain: Secondary | ICD-10-CM

## 2018-07-11 LAB — COMPREHENSIVE METABOLIC PANEL
ALT: 24 U/L (ref 0–35)
AST: 30 U/L (ref 0–37)
Albumin: 5.2 g/dL (ref 3.5–5.2)
Alkaline Phosphatase: 72 U/L (ref 39–117)
BUN: 10 mg/dL (ref 6–23)
CO2: 31 mEq/L (ref 19–32)
Calcium: 9.9 mg/dL (ref 8.4–10.5)
Chloride: 105 mEq/L (ref 96–112)
Creatinine, Ser: 0.61 mg/dL (ref 0.40–1.20)
GFR: 99.14 mL/min (ref >60.00)
Glucose, Bld: 92 mg/dL (ref 70–99)
Potassium: 4.4 mEq/L (ref 3.5–5.1)
Sodium: 146 mEq/L — ABNORMAL HIGH (ref 135–145)
Total Bilirubin: 0.6 mg/dL (ref 0.2–1.2)
Total Protein: 7.5 g/dL (ref 6.0–8.3)

## 2018-07-11 LAB — CBC WITH DIFFERENTIAL/PLATELET
BASOS PCT: 0.2 % (ref 0.0–3.0)
Basophils Absolute: 0 10*3/uL (ref 0.0–0.1)
EOS ABS: 0.2 10*3/uL (ref 0.0–0.7)
Eosinophils Relative: 2.4 % (ref 0.0–5.0)
HEMATOCRIT: 42.8 % (ref 36.0–46.0)
Hemoglobin: 14.3 g/dL (ref 12.0–15.0)
Lymphocytes Relative: 34.5 % (ref 12.0–46.0)
Lymphs Abs: 2.4 10*3/uL (ref 0.7–4.0)
MCHC: 33.5 g/dL (ref 30.0–36.0)
MCV: 94.2 fl (ref 78.0–100.0)
Monocytes Absolute: 0.4 10*3/uL (ref 0.1–1.0)
Monocytes Relative: 6.5 % (ref 3.0–12.0)
Neutro Abs: 3.9 10*3/uL (ref 1.4–7.7)
Neutrophils Relative %: 56.4 % (ref 43.0–77.0)
Platelets: 235 10*3/uL (ref 150.0–400.0)
RBC: 4.54 Mil/uL (ref 3.87–5.11)
RDW: 13.4 % (ref 11.5–15.5)
WBC: 6.8 10*3/uL (ref 4.0–10.5)

## 2018-07-11 LAB — TSH: TSH: 2.77 u[IU]/mL (ref 0.35–4.50)

## 2018-07-11 LAB — LIPID PANEL
Cholesterol: 231 mg/dL — ABNORMAL HIGH (ref 0–200)
HDL: 100.4 mg/dL (ref >39.00)
LDL Cholesterol: 94 mg/dL (ref 0–99)
NonHDL: 131
TRIGLYCERIDES: 186 mg/dL — AB (ref 0.0–149.0)
Total CHOL/HDL Ratio: 2
VLDL: 37.2 mg/dL (ref 0.0–40.0)

## 2018-07-11 NOTE — Progress Notes (Signed)
Sabrina Gibbs DOB: October 15, 1955 Encounter date: 07/11/2018  This is a 63 y.o. female who presents with Chief Complaint  Patient presents with  . Follow-up    History of present illness: Last visit 06/06/18 where we discussed tingling sensation and bloodwork was ordered to complete in Feb. Discussed nodule right hand which was xrayed which was not revealing. Offered attempt for drainage, specialist referral, or monitoring and she decided to monitor.  Last time she had little weird tingles, but now across chest feels like someone is squeezing. Doesn't know how to explain this, but just doesn't feel right. Last time she complained about something similar was sent to gastro and put on prilosec for 14 days and told to come back if not better. Tried this again but no difference in symptoms. Doesn't feel gastro. Tingling is everywhere over whole body, but now additionally has the feeling across upper chest with "constricting sensation" and moves from center to left to right. Notes this all day; but seems better in evening (after a couple of glasses of wine). Never short of breath. Walks dog 2 miles/day and repeats this in afternoon. Not getting worse with exercise, but notes it more during this time because not distracted. Not physically limited.   Twinges are more often than they were previously. These also get better with wine. Not painful, just noting these.   In last couple of weeks has noted across mid back sensation like pulling a muscle; hasn't done anything that she recalls. She does most of lifting and moving things around house.     No Known Allergies Current Meds  Medication Sig  . cholecalciferol (VITAMIN D) 1000 units tablet Take 1,000 Units by mouth daily.  . fluticasone (FLONASE) 50 MCG/ACT nasal spray Place 2 sprays into both nostrils daily.  Marland Kitchen levothyroxine (SYNTHROID, LEVOTHROID) 50 MCG tablet Take 1 tablet (50 mcg total) by mouth daily.    Review of Systems  Constitutional:  Negative for chills, fatigue and fever.  HENT: Negative for congestion.   Eyes: Negative for visual disturbance.  Respiratory: Positive for chest tightness. Negative for cough, choking, shortness of breath and wheezing.   Cardiovascular: Negative for chest pain, palpitations and leg swelling.  Gastrointestinal: Negative for abdominal pain, constipation and diarrhea.  Genitourinary: Negative for difficulty urinating and dysuria.  Musculoskeletal: Negative for arthralgias and back pain.  Skin: Positive for rash (psoriasis flares in winter). Negative for color change.  Neurological: Negative for dizziness, weakness, light-headedness and headaches.       "twinges"     Objective:  BP 122/64 (BP Location: Left Arm, Patient Position: Sitting, Cuff Size: Normal)   Pulse 63   Temp 97.8 F (36.6 C) (Oral)   Wt 143 lb 12.8 oz (65.2 kg)   LMP 10/19/2010   SpO2 98%   BMI 24.68 kg/m   Weight: 143 lb 12.8 oz (65.2 kg)   BP Readings from Last 3 Encounters:  07/11/18 122/64  06/06/18 130/76  02/12/18 132/80   Wt Readings from Last 3 Encounters:  07/11/18 143 lb 12.8 oz (65.2 kg)  06/06/18 141 lb 3.2 oz (64 kg)  02/12/18 141 lb 6.4 oz (64.1 kg)    Physical Exam Constitutional:      General: She is not in acute distress.    Appearance: She is well-developed.  HENT:     Head: Normocephalic and atraumatic.     Right Ear: External ear normal.     Left Ear: External ear normal.     Mouth/Throat:  Pharynx: No oropharyngeal exudate.  Eyes:     Conjunctiva/sclera: Conjunctivae normal.     Pupils: Pupils are equal, round, and reactive to light.  Neck:     Musculoskeletal: Normal range of motion and neck supple.     Thyroid: No thyromegaly.  Cardiovascular:     Rate and Rhythm: Normal rate and regular rhythm.     Heart sounds: Normal heart sounds. No murmur. No friction rub. No gallop.   Pulmonary:     Effort: Pulmonary effort is normal.     Breath sounds: Normal breath sounds.   Abdominal:     General: Bowel sounds are normal. There is no distension.     Palpations: Abdomen is soft. There is no mass.     Tenderness: There is no abdominal tenderness. There is no guarding.     Hernia: No hernia is present.  Musculoskeletal: Normal range of motion.        General: No tenderness or deformity.  Lymphadenopathy:     Cervical: No cervical adenopathy.  Skin:    General: Skin is warm and dry.     Findings: No rash.  Neurological:     Mental Status: She is alert and oriented to person, place, and time.     Cranial Nerves: Cranial nerves are intact.     Sensory: Sensation is intact.     Motor: Motor function is intact.     Coordination: Coordination is intact.     Gait: Gait is intact. Gait and tandem walk normal.     Deep Tendon Reflexes: Reflexes normal.     Reflex Scores:      Tricep reflexes are 2+ on the right side and 2+ on the left side.      Bicep reflexes are 2+ on the right side and 2+ on the left side.      Brachioradialis reflexes are 2+ on the right side and 2+ on the left side.      Patellar reflexes are 2+ on the right side and 2+ on the left side. Psychiatric:        Speech: Speech normal.        Behavior: Behavior normal.        Thought Content: Thought content normal.     Assessment/Plan 1. Chest discomfort No prior EKG for comparison. EKG notable only for incomplete RBBB which we discussed should not be causing her symptoms. No acute changes. Reassuring that symptoms are not worse with exercise and are not associated with any diaphoresis, SOB.  - EKG 12-Lead - DG Chest 2 View; Future - DG Chest 2 View  2. Neuropathy Uncertain etiology. Will complete boodwork today and follow up pending this. Sensation does seem to be worsening so if labwork is not revealing she will need additional evaluation.  3. Tingling See above - Vitamin B12 - Comprehensive metabolic panel - CBC with Differential/Platelet  4. Hypothyroidism, unspecified type -  TSH  5. Lipid screening - Lipid panel   Return pending bloodwork.      Micheline Rough, MD

## 2018-07-12 LAB — VITAMIN B12: Vitamin B-12: 343 pg/mL (ref 211–911)

## 2018-07-16 NOTE — Addendum Note (Signed)
Addended by: Rebecca Eaton on: 07/16/2018 03:01 PM   Modules accepted: Orders

## 2018-07-17 ENCOUNTER — Telehealth: Payer: Self-pay

## 2018-07-17 NOTE — Telephone Encounter (Signed)
Copied from [place] [id number]. Topic: General - Inquiry >> Jul 17, 2018  3:11 PM Conception Chancy, NT wrote: Reason for CRM: patient is calling and states she spoke with a nurse yesterday about needing to come back in for a magnesium blood draw but she never heard back if she definitely needed too. Please advise.

## 2018-07-17 NOTE — Telephone Encounter (Signed)
Result note has been sent to Dr. Ethlyn Gallery. See results for further documentation.

## 2018-07-18 NOTE — Progress Notes (Signed)
Cardiology Office Note   Date:  07/19/2018   ID:  Sabrina Gibbs, DOB May 23, 1956, MRN [id number]  PCP:  Caren Macadam, MD  Cardiologist:   Tacari Repass Martin[place], MD   Chief Complaint  Patient presents with  . Chest Pain      History of Present Illness: Sabrina Gibbs is a 63 y.o. female who is seen at the request of Dr. Azucena Kuba for evaluation of chest pain. She states that over the past 6 months she has experienced an unusual tingling sensation all over her body and she also has some twinges in her anterior chest like something squeezing a muscle. This only lasts a minute or two. No real relation with activity. She is very active walking or riding her bike. In the past she had some chest discomfort that resolved with Prilosec. This hasn't helped now. No known cardiac risk factors.     Past Medical History:  Diagnosis Date  . Allergy to poison ivy    carried prednisone with her when she has possibility of exposure.   . Colon polyp, hyperplastic 2008  . Fibroid   . Thyroid disease     Past Surgical History:  Procedure Laterality Date  . BASAL CELL CARCINOMA EXCISION     nose; follows with dermatology  . KNEE ARTHROSCOPY Left 1999     Current Outpatient Medications  Medication Sig Dispense Refill  . cholecalciferol (VITAMIN D) 1000 units tablet Take 1,000 Units by mouth daily.    . fluticasone (FLONASE) 50 MCG/ACT nasal spray Place 2 sprays into both nostrils daily. 16 g 6  . levothyroxine (SYNTHROID, LEVOTHROID) 50 MCG tablet Take 1 tablet (50 mcg total) by mouth daily. 90 tablet 4   No current facility-administered medications for this visit.     Allergies:   Patient has no known allergies.    Social History:  The patient  reports that she has never smoked. She has never used smokeless tobacco. She reports current alcohol use of about 10.0 standard drinks of alcohol per week. She reports that she does not use drugs.   Family History:  The patient's family  history includes Arrhythmia in her mother; Atrial fibrillation in her mother; Diabetes in her brother; Heart disease in her father and mother; High blood pressure in her sister; Hypertension in her father; Other in her brother.    ROS:  Please see the history of present illness.   Otherwise, review of systems are positive for none.   All other systems are reviewed and negative.    PHYSICAL EXAM: VS:  BP 116/74   Pulse 76   Ht 5\' 4"  (1.626 m)   Wt 145 lb (65.8 kg)   LMP 10/19/2010   BMI 24.89 kg/m  , BMI Body mass index is 24.89 kg/m. GEN: Well nourished, well developed, in no acute distress  HEENT: normal  Neck: no JVD, carotid bruits, or masses Cardiac: RRR; there is a soft gr 6-8/1 systolic murmur upper stenum, no rubs, or gallops,no edema  Respiratory:  clear to auscultation bilaterally, normal work of breathing GI: soft, nontender, nondistended, + BS MS: no deformity or atrophy  Skin: warm and dry, no rash Neuro:  Strength and sensation are intact Psych: euthymic mood, full affect   EKG:  EKG is not ordered today. The ekg ordered 07/11/18 demonstrates NSR with incomplete RBBB. This is a normal variant. I have personally reviewed and interpreted this study.    Recent Labs: 07/11/2018: ALT 24; BUN 10; Creatinine, Ser 0.61;  Hemoglobin 14.3; Platelets 235.0; Potassium 4.4; Sodium 146; TSH 2.77    Lipid Panel    Component Value Date/Time   CHOL 231 (H) 07/11/2018 0851   CHOL 201 (H) 12/06/2016 1554   TRIG 186.0 (H) 07/11/2018 0851   HDL 100.40 07/11/2018 0851   HDL 86 12/06/2016 1554   CHOLHDL 2 07/11/2018 0851   VLDL 37.2 07/11/2018 0851   LDLCALC 94 07/11/2018 0851   LDLCALC 92 12/06/2016 1554      Wt Readings from Last 3 Encounters:  07/19/18 145 lb (65.8 kg)  07/11/18 143 lb 12.8 oz (65.2 kg)  06/06/18 141 lb 3.2 oz (64 kg)      Other studies Reviewed: Additional studies/ records that were reviewed today include: none   ASSESSMENT AND PLAN:  1.  Very  atypical chest symptoms. No risk factors for CAD. Doubt cardiac related. 2.  Heart murmur. Likely benign but given symptoms we will evaluate with an Echo. If normal will reassure.    Current medicines are reviewed at length with the patient today.  The patient does not have concerns regarding medicines.  The following changes have been made:  no change  Labs/ tests ordered today include:   Orders Placed This Encounter  Procedures  . ECHOCARDIOGRAM COMPLETE     Disposition:   FU TBD  Signed, Johnna Bollier Martinique, MD  07/19/2018 3:46 PM    [facility] [address] [place], [place], [address] Phone [phone], Fax [phone]

## 2018-07-19 ENCOUNTER — Encounter: Payer: Self-pay | Admitting: Cardiology

## 2018-07-19 ENCOUNTER — Ambulatory Visit: Payer: BC Managed Care – PPO | Admitting: Cardiology

## 2018-07-19 VITALS — BP 116/74 | HR 76 | Ht 64.0 in | Wt 145.0 lb

## 2018-07-19 DIAGNOSIS — R0789 Other chest pain: Secondary | ICD-10-CM | POA: Diagnosis not present

## 2018-07-19 DIAGNOSIS — R011 Cardiac murmur, unspecified: Secondary | ICD-10-CM | POA: Diagnosis not present

## 2018-07-19 NOTE — Patient Instructions (Signed)
Medication Instructions:  No changes   Lab work: None ordered   Testing/Procedures: Schedule Echo  Follow-Up: At [facility], you and your health needs are our priority.  As part of our continuing mission to provide you with exceptional heart care, we have created designated Provider Care Teams.  These Care Teams include your primary Cardiologist (physician) and Advanced Practice Providers (APPs -  Physician Assistants and Nurse Practitioners) who all work together to provide you with the care you need, when you need it. . Follow up appointment will depend on results

## 2018-07-20 ENCOUNTER — Ambulatory Visit: Payer: BC Managed Care – PPO | Admitting: Internal Medicine

## 2018-07-20 ENCOUNTER — Telehealth: Payer: Self-pay

## 2018-07-20 ENCOUNTER — Encounter: Payer: Self-pay | Admitting: Internal Medicine

## 2018-07-20 VITALS — BP 134/82 | HR 95 | Temp 98.0°F | Wt 146.5 lb

## 2018-07-20 DIAGNOSIS — L409 Psoriasis, unspecified: Secondary | ICD-10-CM | POA: Diagnosis not present

## 2018-07-20 DIAGNOSIS — G629 Polyneuropathy, unspecified: Secondary | ICD-10-CM

## 2018-07-20 DIAGNOSIS — R21 Rash and other nonspecific skin eruption: Secondary | ICD-10-CM | POA: Diagnosis not present

## 2018-07-20 NOTE — Telephone Encounter (Signed)
Left message for patient to call back. CRM created.  Patient needs to be rescheduled/ moved to another providers schedule. Please get more information in regards to reason for visit.  When were bites notice? Painful/itching? Tried any OTC medications?

## 2018-07-20 NOTE — Progress Notes (Signed)
Chief Complaint  Patient presents with  . Rash    pt noticed "bites" on wednesday morning pt states that it itched states that its onthe upper thigh and genital area. Has not changed any soaps and had put a nonitch medicine on it since she gets posion ivy alot. pt states that her husband has not had any only her     HPI: Sabrina Gibbs 63 y.o. [place]   for 2 days of rash on upper thighs that has not progressed is mildly itchy and looks like bites.  Is going out of town for the month to [facility] to their summer home and wants it checked.  They have a pet dog but no bugs and her husband has no other rash. Has just use over-the-counter anti-itch it has not spread and she has no systemic symptoms.  She has a history of psoriasis and does have a topical medicine she uses but cannot give [place] the name and has a dermatologist who follows her.  Has not tried an antihistamine.  She does have a history of great sensitivity to poison ivy. ROS: See pertinent positives and negatives per HPI. No fever joint pains swelling  Generally feels well .   Past Medical History:  Diagnosis Date  . Allergy to poison ivy    carried prednisone with her when she has possibility of exposure.   . Colon polyp, hyperplastic 2008  . Fibroid   . Thyroid disease     Family History  Problem Relation Age of Onset  . High blood pressure Sister   . Other Brother        muscle disease  . Diabetes Brother   . Heart disease Father   . Hypertension Father   . Heart disease Mother   . Atrial fibrillation Mother   . Arrhythmia Mother     Social History   Socioeconomic History  . Marital status: Married    Spouse name: Not on file  . Number of children: 0  . Years of education: Not on file  . Highest education level: Not on file  Occupational History  . Not on file  Social Needs  . Financial resource strain: Not on file  . Food insecurity:    Worry: Not on file    Inability: Not on file    . Transportation needs:    Medical: Not on file    Non-medical: Not on file  Tobacco Use  . Smoking status: Never Smoker  . Smokeless tobacco: Never Used  Substance and Sexual Activity  . Alcohol use: Yes    Alcohol/week: 10.0 standard drinks    Types: 10 Glasses of wine per week  . Drug use: No  . Sexual activity: Yes    Partners: Male    Birth control/protection: Post-menopausal  Lifestyle  . Physical activity:    Days per week: Not on file    Minutes per session: Not on file  . Stress: Not on file  Relationships  . Social connections:    Talks on phone: Not on file    Gets together: Not on file    Attends religious service: Not on file    Active member of club or organization: Not on file    Attends meetings of clubs or organizations: Not on file    Relationship status: Not on file  Other Topics Concern  . Not on file  Social History Narrative  . Not on file    Outpatient  Medications Prior to Visit  Medication Sig Dispense Refill  . cholecalciferol (VITAMIN D) 1000 units tablet Take 1,000 Units by mouth daily.    . fluticasone (FLONASE) 50 MCG/ACT nasal spray Place 2 sprays into both nostrils daily. 16 g 6  . levothyroxine (SYNTHROID, LEVOTHROID) 50 MCG tablet Take 1 tablet (50 mcg total) by mouth daily. 90 tablet 4   No facility-administered medications prior to visit.      EXAM:  BP 134/82 (BP Location: Right Arm, Patient Position: Sitting, Cuff Size: Normal)   Pulse 95   Temp 98 F (36.7 C) (Oral)   Wt 146 lb 8 oz (66.5 kg)   LMP 10/19/2010   BMI 25.15 kg/m   Body mass index is 25.15 kg/m.  GENERAL: vitals reviewed and listed above, alert, oriented, appears well hydrated and in no acute distress HEENT: atraumatic, conjunctiva  clear, no obvious abnormalities on inspection of external nose and ears MS: moves all extremities without noticeable focal  Abnormality Examination of the skin shows small pink raised round lesions that look like bug bites but  no pustules center and no plaque.  There is some faded scaly patches of her psoriasis on the lower extremity.  Distribution of these lesions are proximal legs a few on the lower abdomen none on the back face or distal extremities.  PSYCH: pleasant and cooperative, no obvious depression or anxiety  BP Readings from Last 3 Encounters:  07/20/18 134/82  07/19/18 116/74  07/11/18 122/64    ASSESSMENT AND PLAN:  Discussed the following assessment and plan:  Rash  Psoriasis  Neuropathy - Plan: Magnesium, RBC Uncertain cause they look a lot like bug bites but atypical distribution she does have psoriasis is not pustular question could be early guttate but very localized. Localized care antihistamines moisturization and and topicals expectant management discussed with patient. -Patient advised to return or notify health care team  if  new concerns arise.  Patient Instructions    Looks like bug bites   Could be a form of psoriasis   moiturizing and can use cortisone topical if needed.  Seek care if fever or looks like pustules and if  persistent or progressive can see your dermatologist  Standley Brooking. Tyree Vandruff M.D.

## 2018-07-20 NOTE — Telephone Encounter (Signed)
Patient has been moved to another schedule.

## 2018-07-20 NOTE — Patient Instructions (Signed)
   Looks like bug bites   Could be a form of psoriasis   moiturizing and can use cortisone topical if needed.  Seek care if fever or looks like pustules and if  persistent or progressive can see your dermatologist

## 2018-07-23 ENCOUNTER — Ambulatory Visit (HOSPITAL_COMMUNITY): Payer: BC Managed Care – PPO | Attending: Cardiology

## 2018-07-23 DIAGNOSIS — R0789 Other chest pain: Secondary | ICD-10-CM | POA: Insufficient documentation

## 2018-07-23 DIAGNOSIS — R011 Cardiac murmur, unspecified: Secondary | ICD-10-CM | POA: Insufficient documentation

## 2018-07-23 LAB — MAGNESIUM, RBC: Magnesium RBC: 5.6 mg/dL (ref 4.0–6.4)

## 2018-07-23 LAB — TIQ-NTM

## 2018-10-15 ENCOUNTER — Encounter: Payer: Self-pay | Admitting: Family Medicine

## 2018-10-15 ENCOUNTER — Ambulatory Visit (INDEPENDENT_AMBULATORY_CARE_PROVIDER_SITE_OTHER): Payer: BC Managed Care – PPO | Admitting: Family Medicine

## 2018-10-15 ENCOUNTER — Other Ambulatory Visit: Payer: Self-pay

## 2018-10-15 DIAGNOSIS — R0789 Other chest pain: Secondary | ICD-10-CM | POA: Diagnosis not present

## 2018-10-15 DIAGNOSIS — G629 Polyneuropathy, unspecified: Secondary | ICD-10-CM

## 2018-10-15 DIAGNOSIS — M62838 Other muscle spasm: Secondary | ICD-10-CM | POA: Diagnosis not present

## 2018-10-15 NOTE — Progress Notes (Signed)
Virtual Visit via Video Note  I connected with@ on 10/15/18 at 10:30 AM EDT by a video enabled telemedicine application and verified that I am speaking with the correct person using two identifiers.  Location patient: home Location provider:work or home office Persons participating in the virtual visit: patient, provider  I discussed the limitations of evaluation and management by telemedicine and the availability of in person appointments. The patient expressed understanding and agreed to proceed.   Sabrina Gibbs DOB: 01/03/1956 Encounter date: 10/15/2018  This is a 63 y.o. female who presents with Chief Complaint  Patient presents with  . Numbness    described as a quick ache, slightly worse from last ov, chest is the worst, throughout the whole body    History of present illness:  We had discussed these back in December. Twinges and tingles that happen through day, go away in evening (after a drink usually). Gets these all over - head, arms/legs.   Then coronavirus came up so tried to push off coming in, but now is getting more troubling with sx.   Chest feels like there is "brick" on it. Doesn't feel like allergies. Feels like there are muscle tightening above chest. Then between breast and right below there is heavier sensation (like weight); goes away after a second. Saw GI in past and she tried prilosec for 2 weeks which worked a few years ago, but didn't seem to help this time. Chest discomfort is always there now. 4-10 times/hour. Just last a second or less. Then the heaviness is pretty much always there. Twinges are gone in evening, but chest pressure is always there. Doesn't wake her up at night. Stays same with exercise; still walking 4 miles/day. Does feel some superficial twitching/spasm but also deep. Not noticed change when eating.   Every week to ten days about an hour or two after BM there is small tiny amount of blood. Can feel different sensation when she is having  BM that something is different.    Has put on about 5lb in last month.   Nothing else she has taken or tried otc to help with symptoms.    No Known Allergies Current Meds  Medication Sig  . cholecalciferol (VITAMIN D) 1000 units tablet Take 1,000 Units by mouth daily.  . fluticasone (FLONASE) 50 MCG/ACT nasal spray Place 2 sprays into both nostrils daily.  Marland Kitchen levothyroxine (SYNTHROID, LEVOTHROID) 50 MCG tablet Take 1 tablet (50 mcg total) by mouth daily.    Review of Systems  Constitutional: Negative for activity change, appetite change, fatigue and unexpected weight change.  Respiratory: Positive for chest tightness. Negative for cough, shortness of breath and wheezing.   Cardiovascular: Negative for chest pain (only uncomfortable with muscle twinges).  Gastrointestinal: Positive for blood in stool (see hpi). Negative for abdominal distention, abdominal pain, nausea and vomiting.       No trouble with swallowing. No noted heartburn.  Neurological: Positive for tremors (hands have always been shaky - since teen years; no worsening). Negative for dizziness, weakness, light-headedness and headaches.    Objective:  LMP 10/19/2010       BP Readings from Last 3 Encounters:  07/20/18 134/82  07/19/18 116/74  07/11/18 122/64   Wt Readings from Last 3 Encounters:  07/20/18 146 lb 8 oz (66.5 kg)  07/19/18 145 lb (65.8 kg)  07/11/18 143 lb 12.8 oz (65.2 kg)    EXAM:  GENERAL: alert, oriented, sounds well and in no acute distress  LUNGS: no shortness  of breath noted during conversation.  PSYCH/NEURO: pleasant and cooperative, no obvious depression or anxiety, speech and thought processing grossly intact   Assessment/Plan  1. Neuropathy Progressing symptoms for at least 6 months with full body sensation of "tingles". Initial bloodwork was normal. Chest tightness prompted cardiology eval which was stable. Now twinges are more noted and there is additional ? Muscle spasm or  tightening in chest. I would like neurology to further evaluate. Of interest is brother with? Muscular disease for which he requires injections (patient not sure name of disease) - Ambulatory referral to Neurology  2. Chest tightness Stable; does not sounds cardiac as there is no progression with activity and patient walks 4 miles daily. Reviewed cardiology note. Reviewed previous bloodwork along with chest xray.  3. Muscle spasm See above. Considered esoph spasm, but other symptoms accompanying do not go along with this dx. Additionally there is no association with eating. Would consider GI referral if change/worsening in sx suggests GI source.      I discussed the assessment and treatment plan with the patient. The patient was provided an opportunity to ask questions and all were answered. The patient agreed with the plan and demonstrated an understanding of the instructions.   The patient was advised to call back or seek an in-person evaluation if the symptoms worsen or if the condition fails to improve as anticipated.  I provided 30 minutes of non-face-to-face time during this encounter.   Micheline Rough, MD

## 2018-10-16 NOTE — Progress Notes (Signed)
New Patient Virtual Visit via Video Note The purpose of this virtual visit is to provide medical care while limiting exposure to the novel coronavirus.    Consent was obtained for video visit:  Yes.   Answered questions that patient had about telehealth interaction:  Yes.   I discussed the limitations, risks, security and privacy concerns of performing an evaluation and management service by telemedicine. I also discussed with the patient that there may be a patient responsible charge related to this service. The patient expressed understanding and agreed to proceed.  Pt location: Home Physician Location: office Name of referring provider:  Caren Macadam, MD I connected with Gordy Savers at patients initiation/request on 10/19/2018 at 11:00 AM EDT by video enabled telemedicine application and verified that I am speaking with the correct person using two identifiers. Pt MRN:  [id number] Pt DOB:  Nov 08, 1955 Video Participants:  Gordy Savers    History of Present Illness: Elienai Gailey is a 63 y.o. right-handed Caucasian female with hypothyroidism presenting for evaluation of generalized tingling.   Starting around the fall of 2019, she began having spells of tingling over her entire body, which is very brief, and recurs throughout the day.  Symptoms are sporadic and can involve different parts of the body throughout the day including the face, scalp, arms, chest, torso, back, legs, and feet.  There is no specific pattern, no identifiable triggers, or alleviating factors.  She denies any changes in mood, life stresses, or diet.  Her vitamin B12 and thyroid studies are normal.  She does not have any associated weakness, imbalance, or falls.  No migraines.  Out-side paper records, electronic medical record, and images have been reviewed where available and summarized as:  No results found for: HGBA1C Lab Results  Component Value Date   VITAMINB12 343 07/11/2018   Lab Results   Component Value Date   TSH 2.77 07/11/2018   No results found for: ESRSEDRATE, POCTSEDRATE  Past Medical History:  Diagnosis Date  . Allergy to poison ivy    carried prednisone with her when she has possibility of exposure.   . Colon polyp, hyperplastic 2008  . Fibroid   . Thyroid disease     Past Surgical History:  Procedure Laterality Date  . BASAL CELL CARCINOMA EXCISION     nose; follows with dermatology  . KNEE ARTHROSCOPY Left 1999     Medications:  Outpatient Encounter Medications as of 10/19/2018  Medication Sig  . cholecalciferol (VITAMIN D) 1000 units tablet Take 1,000 Units by mouth daily.  . fluticasone (FLONASE) 50 MCG/ACT nasal spray Place 2 sprays into both nostrils daily.  Marland Kitchen levothyroxine (SYNTHROID, LEVOTHROID) 50 MCG tablet Take 1 tablet (50 mcg total) by mouth daily.   No facility-administered encounter medications on file as of 10/19/2018.     Allergies: No Known Allergies  Family History: Family History  Problem Relation Age of Onset  . High blood pressure Sister   . Other Brother        muscle disease - muscular neuropathy; gets infusions  . Diabetes Brother   . Heart disease Father   . Hypertension Father   . Heart disease Mother   . Atrial fibrillation Mother   . Arrhythmia Mother   . Neuropathy Other        diabetic related    Social History: Social History   Tobacco Use  . Smoking status: Never Smoker  . Smokeless tobacco: Never Used  Substance Use Topics  . Alcohol use:  Yes    Alcohol/week: 10.0 standard drinks    Types: 10 Glasses of wine per week  . Drug use: No   Social History   Social History Narrative  . Not on file    Review of Systems:  CONSTITUTIONAL: No fevers, chills, night sweats, or weight loss.   EYES: No visual changes or eye pain ENT: No hearing changes.  No history of nose bleeds.   RESPIRATORY: No cough, wheezing and shortness of breath.   CARDIOVASCULAR: Negative for chest pain, and palpitations.    GI: Negative for abdominal discomfort, blood in stools or black stools.  No recent change in bowel habits.   GU:  No history of incontinence.   MUSCLOSKELETAL: No history of joint pain or swelling.  No myalgias.   SKIN: Negative for lesions, rash, and itching.   HEMATOLOGY/ONCOLOGY: Negative for prolonged bleeding, bruising easily, and swollen nodes.  No history of cancer.   ENDOCRINE: Negative for cold or heat intolerance, polydipsia or goiter.   PSYCH:  No depression or anxiety symptoms.   NEURO: As Above.   Vital Signs:  Ht 5' 4.5" (1.638 m)   Wt 145 lb (65.8 kg)   LMP 10/19/2010   BMI 24.50 kg/m    General Medical Exam:  Well appearing, comfortable.  Nonlabored breathing.    Neurological Exam: MENTAL STATUS including orientation to time, place, person, recent and remote memory, attention span and concentration, language, and fund of knowledge is normal.  Speech is not dysarthric.  CRANIAL NERVES:  Normal conjugate, extra-ocular eye movements in all directions of gaze.  No ptosis.  Normal facial symmetry and movements.  Normal shoulder shrug and head rotation.  Tongue is midline.  MOTOR:  Antigravity in all extremities.  No abnormal movements.  No pronator drift.   COORDINATION/GAIT: Normal finger to nose bilaterally.  Intact rapid alternating movements bilaterally.  Able to rise from a chair without using arms.  Gait narrow based and stable.  IMPRESSION: Migratory paresthesias, nonspecific and does not conform to the cutaneous nerve or dermatomal distribution.  -MRI brain with and without contrast to evaluate for demyelinating disease, although my overall suspicion is very low given variable symptoms.  -Check MMA, vitamin B1, folate, and copper  Follow Up Instructions:  I discussed the assessment and treatment plan with the patient. The patient was provided an opportunity to ask questions and all were answered. The patient agreed with the plan and demonstrated an understanding  of the instructions.   The patient was advised to call back or seek an in-person evaluation if the symptoms worsen or if the condition fails to improve as anticipated.  Return to clinic after testing   Alda Berthold, DO

## 2018-10-19 ENCOUNTER — Other Ambulatory Visit: Payer: Self-pay

## 2018-10-19 ENCOUNTER — Encounter: Payer: Self-pay | Admitting: *Deleted

## 2018-10-19 ENCOUNTER — Encounter: Payer: Self-pay | Admitting: Neurology

## 2018-10-19 ENCOUNTER — Telehealth (INDEPENDENT_AMBULATORY_CARE_PROVIDER_SITE_OTHER): Payer: BC Managed Care – PPO | Admitting: Neurology

## 2018-10-19 VITALS — Ht 64.5 in | Wt 145.0 lb

## 2018-10-19 DIAGNOSIS — R202 Paresthesia of skin: Secondary | ICD-10-CM

## 2018-10-19 DIAGNOSIS — R209 Unspecified disturbances of skin sensation: Secondary | ICD-10-CM

## 2018-10-19 NOTE — Addendum Note (Signed)
Addended by: Chester Holstein on: 10/19/2018 12:01 PM   Modules accepted: Orders

## 2018-10-19 NOTE — Progress Notes (Signed)
MRI and labs ordered.

## 2018-10-25 ENCOUNTER — Telehealth: Payer: Self-pay | Admitting: Neurology

## 2018-10-25 NOTE — Telephone Encounter (Signed)
Pt does have lab work ordered, pt stated that she will come the day of he MRI to have her lab work done.

## 2018-10-25 NOTE — Telephone Encounter (Signed)
Patient left message with after hour answering service at 4:59 pm on 10-24-18    Patient states she has MRI on 10-29-18 @ 3:00pm the caller states the office wanted to also have lab work schedule

## 2018-10-29 ENCOUNTER — Ambulatory Visit
Admission: RE | Admit: 2018-10-29 | Discharge: 2018-10-29 | Disposition: A | Discharge status: Home / Self Care | Payer: BC Managed Care – PPO | Source: Ambulatory Visit | Attending: Neurology | Admitting: Neurology

## 2018-10-29 ENCOUNTER — Other Ambulatory Visit: Payer: Self-pay

## 2018-10-29 DIAGNOSIS — R202 Paresthesia of skin: Secondary | ICD-10-CM

## 2018-10-29 MED ORDER — GADOBENATE DIMEGLUMINE 529 MG/ML IV SOLN
13.0000 mL | Freq: Once | INTRAVENOUS | Status: AC | PRN
  Administered 2018-10-29: 16:00:00 13 mL via INTRAVENOUS

## 2018-10-30 ENCOUNTER — Other Ambulatory Visit (INDEPENDENT_AMBULATORY_CARE_PROVIDER_SITE_OTHER): Payer: BC Managed Care – PPO

## 2018-10-30 ENCOUNTER — Other Ambulatory Visit: Payer: Self-pay

## 2018-10-30 DIAGNOSIS — R209 Unspecified disturbances of skin sensation: Secondary | ICD-10-CM

## 2018-10-30 DIAGNOSIS — R202 Paresthesia of skin: Secondary | ICD-10-CM

## 2018-10-30 LAB — FOLATE: Folate: >23.3 ng/mL (ref >5.9)

## 2018-11-04 LAB — VITAMIN B1: Vitamin B1 (Thiamine): 6 nmol/L — ABNORMAL LOW (ref 8–30)

## 2018-11-04 LAB — METHYLMALONIC ACID, SERUM: Methylmalonic Acid, Quant: 112 nmol/L (ref 87–318)

## 2018-11-04 LAB — COPPER, SERUM: Copper: 176 ug/dL — ABNORMAL HIGH (ref 70–175)

## 2018-11-05 ENCOUNTER — Telehealth: Payer: Self-pay | Admitting: Neurology

## 2018-11-05 NOTE — Telephone Encounter (Signed)
Patient called regarding her results and needing to clarify her B 12 levels and what amount she needs over the counter? Please Call. Thanks

## 2018-11-05 NOTE — Telephone Encounter (Signed)
Please inform patient she needs to take vitamin B1 (thaimine) 100mg  daily.   Vitamin B12 level is normal.

## 2018-11-05 NOTE — Telephone Encounter (Signed)
Left message for pt to return call.

## 2018-11-05 NOTE — Telephone Encounter (Signed)
Pt informed of normal B12 and to purchase B1 100mg  ( thiamine) and take daily.

## 2018-11-22 ENCOUNTER — Telehealth: Payer: BC Managed Care – PPO | Admitting: Family

## 2018-11-22 DIAGNOSIS — L237 Allergic contact dermatitis due to plants, except food: Secondary | ICD-10-CM

## 2018-11-22 MED ORDER — PREDNISONE 10 MG (21) PO TBPK: ORAL_TABLET | ORAL | 0 refills | Status: DC

## 2018-11-22 NOTE — Progress Notes (Signed)

## 2019-02-27 ENCOUNTER — Other Ambulatory Visit: Payer: Self-pay | Admitting: Obstetrics & Gynecology

## 2019-02-27 NOTE — Telephone Encounter (Signed)
Medication refill request: Levothyroxine  Last AEX:  02/12/18 Next AEX: 06/07/19 Last MMG (if hormonal medication request):  07/05/17 bi rads 1 neg  Refill authorized: #90 with 0RF pended for today.

## 2019-05-31 ENCOUNTER — Other Ambulatory Visit: Payer: Self-pay | Admitting: Obstetrics & Gynecology

## 2019-05-31 DIAGNOSIS — Z01419 Encounter for gynecological examination (general) (routine) without abnormal findings: Secondary | ICD-10-CM

## 2019-05-31 NOTE — Telephone Encounter (Signed)
Spoke to pt. Pt states having enough Synthroid until appt for AEX on 06/07/2019, but wanted to go ahead and pick up all Rx at one time d/t Covid.   Med refill request: Synthroid Last AEX: 02/12/2018 Next AEX:06/07/19 Last MMG (if hormonal med) na Refill authorized: #90, 0 RF . Pended if approved. Need RF??

## 2019-06-05 ENCOUNTER — Other Ambulatory Visit: Payer: Self-pay

## 2019-06-05 NOTE — Telephone Encounter (Signed)
[place] called to check status of refill request. [phone].

## 2019-06-05 NOTE — Telephone Encounter (Signed)
See RF request from 05/31/19. #90 with 0RF pended if approved.

## 2019-06-07 ENCOUNTER — Other Ambulatory Visit: Payer: Self-pay

## 2019-06-07 ENCOUNTER — Ambulatory Visit (INDEPENDENT_AMBULATORY_CARE_PROVIDER_SITE_OTHER): Payer: BC Managed Care – PPO | Admitting: Obstetrics & Gynecology

## 2019-06-07 ENCOUNTER — Encounter: Payer: Self-pay | Admitting: Obstetrics & Gynecology

## 2019-06-07 ENCOUNTER — Encounter

## 2019-06-07 VITALS — BP 128/80 | HR 68 | Temp 97.0°F | Resp 12 | Ht 64.0 in | Wt 143.0 lb

## 2019-06-07 DIAGNOSIS — Z01419 Encounter for gynecological examination (general) (routine) without abnormal findings: Secondary | ICD-10-CM

## 2019-06-07 MED ORDER — LEVOTHYROXINE SODIUM 50 MCG PO TABS: 50.0000 ug | ORAL_TABLET | Freq: Every day | ORAL | 4 refills | Status: DC

## 2019-06-07 NOTE — Progress Notes (Signed)
63 y.o. G0P0000 Married White or Caucasian female here for annual exam.  Doing well.  Denies vaginal bleeding.    Having paresthesia symptoms.  Discussed with Dr. Ethlyn Gallery, her new PCP.  Has done extensive blood work.  Is taking more B complex.   Has also seen Dr. Posey Pronto as well.  Evaluation is other negative.    Patient's last menstrual period was 10/19/2010.          Sexually active: Yes.    The current method of family planning is post menopausal status.    Exercising: Yes.    walking Smoker:  no  Health Maintenance: Pap:  12/06/16 Neg. HR HPV:neg              06/06/14 neg  History of abnormal Pap:  no MMG:  07/05/17 BIRADS 1 negative/density b Colonoscopy:  2008.  Follow up 10 years recommended.  Did cologuard 05/22/18.     BMD:   07/05/17 Normal TDaP:  02/28/11 Pneumonia vaccine(s):  04/17/19 Pneumovax Shingrix:  Is planning on having this done this year Hep C testing: 08/28/15 Neg Screening Labs: PCP   reports that she has never smoked. She has never used smokeless tobacco. She reports current alcohol use of about 10.0 standard drinks of alcohol per week. She reports that she does not use drugs.  Past Medical History:  Diagnosis Date  . Allergy to poison ivy    carried prednisone with her when she has possibility of exposure.   . Colon polyp, hyperplastic 2008  . Fibroid   . Thyroid disease     Past Surgical History:  Procedure Laterality Date  . BASAL CELL CARCINOMA EXCISION     nose; follows with dermatology  . KNEE ARTHROSCOPY Left 1999    Current Outpatient Medications  Medication Sig Dispense Refill  . cholecalciferol (VITAMIN D) 1000 units tablet Take 1,000 Units by mouth daily.    . fluticasone (FLONASE) 50 MCG/ACT nasal spray Place 2 sprays into both nostrils daily. 16 g 6  . levothyroxine (SYNTHROID) 50 MCG tablet TAKE 1 TABLET ONCE DAILY. 90 tablet 0  . predniSONE (STERAPRED UNI-PAK 21 TAB) 10 MG (21) TBPK tablet As directed (Patient not taking: Reported on  06/07/2019) 21 tablet 0   No current facility-administered medications for this visit.    Family History  Problem Relation Age of Onset  . High blood pressure Sister   . Other Brother        muscle disease - muscular neuropathy; gets infusions  . Diabetes Brother   . Heart disease Father   . Hypertension Father   . Heart disease Mother   . Atrial fibrillation Mother   . Arrhythmia Mother   . Neuropathy Other        diabetic related    Review of Systems  All other systems reviewed and are negative.   Exam:   BP 128/80 (BP Location: Left Arm, Patient Position: Sitting, Cuff Size: Normal)   Pulse 68   Temp (!) 97 F (36.1 C) (Temporal)   Resp 12   Ht 5\' 4"  (1.626 m)   Wt 143 lb (64.9 kg)   LMP 10/19/2010   BMI 24.55 kg/m   Height: 5\' 4"  (162.6 cm)  Ht Readings from Last 3 Encounters:  06/07/19 5\' 4"  (1.626 m)  10/19/18 5' 4.5" (1.638 m)  07/19/18 5\' 4"  (1.626 m)    General appearance: alert, cooperative and appears stated age Head: Normocephalic, without obvious abnormality, atraumatic Neck: no adenopathy, supple, symmetrical, trachea  midline and thyroid normal to inspection and palpation Lungs: clear to auscultation bilaterally Breasts: normal appearance, no masses or tenderness Heart: regular rate and rhythm Abdomen: soft, non-tender; bowel sounds normal; no masses,  no organomegaly Extremities: extremities normal, atraumatic, no cyanosis or edema Skin: Skin color, texture, turgor normal. No rashes or lesions Lymph nodes: Cervical, supraclavicular, and axillary nodes normal. No abnormal inguinal nodes palpated Neurologic: Grossly normal  Pelvic: External genitalia:  no lesions              Urethra:  normal appearing urethra with no masses, tenderness or lesions              Bartholins and Skenes: normal                 Vagina: normal appearing vagina with normal color and discharge, no lesions              Cervix: no lesions              Pap taken:  No. Bimanual Exam:  Uterus:  normal size, contour, position, consistency, mobility, non-tender              Adnexa: normal adnexa and no mass, fullness, tenderness               Rectovaginal: Confirms               Anus:  normal sphincter tone, no lesions  Chaperone was present for exam.  A:  Well Woman with normal exam PMP, no HRT H/o uterine fibroids Hypothyroidism, h/o 13mm solid nodule on u/s 2015.  Follow up only recommended if has risks for cancer  P:   Mammogram guidelines reviewed.  She will call to schedule.  Did not realize she didn't have MMG last year. pap smear neg with neg HR HPV 2018.  Not indicated today. RF for synthroid 42mcg daily  #90/4RF Lab work done with Dr. Ethlyn Gallery this year Cologuard done 2019 with Dr. Collene Mares Repeat BMD 2024 Vaccines UTD except shingrix which is plans on doing in 2021 return annually or prn

## 2019-06-07 NOTE — Telephone Encounter (Signed)
RF completed today when patient in the office.

## 2019-06-28 ENCOUNTER — Other Ambulatory Visit: Payer: Self-pay | Admitting: Obstetrics & Gynecology

## 2019-06-28 DIAGNOSIS — Z1231 Encounter for screening mammogram for malignant neoplasm of breast: Secondary | ICD-10-CM

## 2019-07-03 ENCOUNTER — Other Ambulatory Visit: Payer: Self-pay

## 2019-07-03 ENCOUNTER — Ambulatory Visit
Admission: RE | Admit: 2019-07-03 | Discharge: 2019-07-03 | Disposition: A | Discharge status: Home / Self Care | Payer: BC Managed Care – PPO | Source: Ambulatory Visit | Attending: Obstetrics & Gynecology | Admitting: Obstetrics & Gynecology

## 2019-07-03 DIAGNOSIS — Z1231 Encounter for screening mammogram for malignant neoplasm of breast: Secondary | ICD-10-CM

## 2019-07-10 ENCOUNTER — Ambulatory Visit: Payer: BC Managed Care – PPO | Attending: Internal Medicine

## 2019-07-10 DIAGNOSIS — Z23 Encounter for immunization: Secondary | ICD-10-CM

## 2019-07-10 NOTE — Progress Notes (Signed)
   Covid-19 Vaccination Clinic  Name:  Sabrina Gibbs    MRN: [id number] DOB: 07-30-1955  07/10/2019  Sabrina Gibbs was observed post Covid-19 immunization for 15 minutes without incidence. She was provided with Vaccine Information Sheet and instruction to access the V-Safe system.   Sabrina Gibbs was instructed to call 911 with any severe reactions post vaccine: Marland Kitchen Difficulty breathing  . Swelling of your face and throat  . A fast heartbeat  . A bad rash all over your body  . Dizziness and weakness    Immunizations Administered    Name Date Dose VIS Date Route   Pfizer COVID-19 Vaccine 07/10/2019  9:51 AM 0.3 mL 05/31/2019 Intramuscular   Manufacturer: [place]   Lot: [id number]   [place]: [id number]

## 2019-07-31 ENCOUNTER — Ambulatory Visit: Payer: BC Managed Care – PPO | Attending: Internal Medicine

## 2019-07-31 DIAGNOSIS — Z23 Encounter for immunization: Secondary | ICD-10-CM | POA: Insufficient documentation

## 2019-07-31 NOTE — Progress Notes (Signed)
   Covid-19 Vaccination Clinic  Name:  Sabrina Gibbs    MRN: [id number] DOB: 1956/01/19  07/31/2019  [name] was observed post Covid-19 immunization for 15 minutes without incidence. She was provided with Vaccine Information Sheet and instruction to access the V-Safe system.   [name] was instructed to call 911 with any severe reactions post vaccine: Marland Kitchen Difficulty breathing  . Swelling of your face and throat  . A fast heartbeat  . A bad rash all over your body  . Dizziness and weakness    Immunizations Administered    Name Date Dose VIS Date Route   Pfizer COVID-19 Vaccine 07/31/2019  1:37 PM 0.3 mL 05/31/2019 Intramuscular   Manufacturer: [place]   Lot: [id number]   [place]: [id number]

## 2019-11-27 ENCOUNTER — Encounter: Payer: Self-pay | Admitting: Vascular Surgery

## 2019-11-27 ENCOUNTER — Other Ambulatory Visit: Payer: Self-pay

## 2019-11-27 ENCOUNTER — Ambulatory Visit: Payer: BC Managed Care – PPO | Admitting: Vascular Surgery

## 2019-11-27 VITALS — BP 133/90 | HR 88 | Temp 98.1°F | Resp 14 | Ht 64.0 in | Wt 144.0 lb

## 2019-11-27 DIAGNOSIS — M79605 Pain in left leg: Secondary | ICD-10-CM

## 2019-11-27 DIAGNOSIS — M79604 Pain in right leg: Secondary | ICD-10-CM

## 2019-11-27 NOTE — Progress Notes (Signed)
Referring Physician: Dr Ubaldo Glassing  Patient name: Sabrina Gibbs MRN: [id number] DOB: 1956/05/17 Sex: female  REASON FOR CONSULT: Bilateral leg pain  HPI: Sabrina Gibbs is a 64 y.o. female, a several year history of slowly worsening bilateral leg pain.  Most of the pain is in the anterior aspect of her leg.  She states that this becomes progressively worse as the day moves on.  She had develops heaviness fullness and aching in both legs.  The left leg is slightly worse than the right.  She has no prior history of DVT.  She has no family history of varicose veins.  She has not really worn compression stockings in the past.  Has also noted some development of spider type varicosities in her legs.  Past Medical History:  Diagnosis Date  . Allergy to poison ivy    carried prednisone with her when she has possibility of exposure.   . Colon polyp, hyperplastic 2008  . Fibroid   . Thyroid disease    Past Surgical History:  Procedure Laterality Date  . BASAL CELL CARCINOMA EXCISION     nose; follows with dermatology  . KNEE ARTHROSCOPY Left 1999    Family History  Problem Relation Age of Onset  . High blood pressure Sister   . Other Brother        muscle disease - muscular neuropathy; gets infusions  . Diabetes Brother   . Heart disease Father   . Hypertension Father   . Heart disease Mother   . Atrial fibrillation Mother   . Arrhythmia Mother   . Neuropathy Other        diabetic related    SOCIAL HISTORY: Social History   Socioeconomic History  . Marital status: Married    Spouse name: Not on file  . Number of children: 0  . Years of education: Not on file  . Highest education level: Not on file  Occupational History  . Not on file  Tobacco Use  . Smoking status: Never Smoker  . Smokeless tobacco: Never Used  Substance and Sexual Activity  . Alcohol use: Yes    Alcohol/week: 10.0 standard drinks    Types: 10 Glasses of wine per week  . Drug use: No  . Sexual  activity: Yes    Partners: Male    Birth control/protection: Post-menopausal  Other Topics Concern  . Not on file  Social History Narrative  . Not on file   Social Determinants of Health   Financial Resource Strain:   . Difficulty of Paying Living Expenses:   Food Insecurity:   . Worried About Charity fundraiser in the Last Year:   . Arboriculturist in the Last Year:   Transportation Needs:   . Film/video editor (Medical):   Marland Kitchen Lack of Transportation (Non-Medical):   Physical Activity:   . Days of Exercise per Week:   . Minutes of Exercise per Session:   Stress:   . Feeling of Stress :   Social Connections:   . Frequency of Communication with Friends and Family:   . Frequency of Social Gatherings with Friends and Family:   . Attends Religious Services:   . Active Member of Clubs or Organizations:   . Attends Archivist Meetings:   Marland Kitchen Marital Status:   Intimate Partner Violence:   . Fear of Current or Ex-Partner:   . Emotionally Abused:   Marland Kitchen Physically Abused:   . Sexually Abused:  No Known Allergies  Current Outpatient Medications  Medication Sig Dispense Refill  . cholecalciferol (VITAMIN D) 1000 units tablet Take 1,000 Units by mouth daily.    . fluticasone (FLONASE) 50 MCG/ACT nasal spray Place 2 sprays into both nostrils daily. 16 g 6  . levothyroxine (SYNTHROID) 50 MCG tablet Take 1 tablet (50 mcg total) by mouth daily. 90 tablet 4   No current facility-administered medications for this visit.    ROS:   General:  No weight loss, Fever, chills  HEENT: No recent headaches, no nasal bleeding, no visual changes, no sore throat  Neurologic: No dizziness, blackouts, seizures. No recent symptoms of stroke or mini- stroke. No recent episodes of slurred speech, or temporary blindness.  Cardiac: No recent episodes of chest pain/pressure, no shortness of breath at rest.  No shortness of breath with exertion.  Denies history of atrial fibrillation or  irregular heartbeat  Vascular: No history of rest pain in feet.  No history of claudication.  No history of non-healing ulcer, No history of DVT   Pulmonary: No home oxygen, no productive cough, no hemoptysis,  No asthma or wheezing  Musculoskeletal:  [ ]  Arthritis, [ ]  Low back pain,  [ ]  Joint pain  Hematologic:No history of hypercoagulable state.  No history of easy bleeding.  No history of anemia  Gastrointestinal: No hematochezia or melena,  No gastroesophageal reflux, no trouble swallowing  Urinary: [ ]  chronic Kidney disease, [ ]  on HD - [ ]  MWF or [ ]  TTHS, [ ]  Burning with urination, [ ]  Frequent urination, [ ]  Difficulty urinating;   Skin: No rashes  Psychological: No history of anxiety,  No history of depression   Physical Examination   Vitals:   11/27/19 1514  BP: 133/90  Pulse: 88  Resp: 14  Temp: 98.1 F (36.7 C)  TempSrc: Temporal  SpO2: 97%  Weight: 144 lb (65.3 kg)  Height: 5\' 4"  (1.626 m)   General:  Alert and oriented, no acute distress HEENT: Normal Neck: No JVD Cardiac: Regular Rate and Rhythm without murmur Skin: No rash, few scattered spider type varicosities mid leg bilaterally Extremity Pulses:  2+dorsalis pedis pulses bilaterally Musculoskeletal: No deformity or edema  Neurologic: Upper and lower extremity motor 5/5 and symmetric   ASSESSMENT: Patient with bilateral leg pain left greater than right.  Potentially this is related to venous reflux.  She does not really have any significant varicosities seen on the skin surface but she does have a few spider type varicosities.   PLAN: She was given a prescription today for bilateral lower extremity thigh-high 20 to 30 mmHg compression stockings for symptomatic relief.  She will return in 3 months time for a duplex ultrasound to evaluate for reflux and consideration for laser ablation if she has significant reflux and dilation.   Ruta Hinds, MD [facility]  [place] Office: [phone] Pager: [phone]

## 2019-12-03 ENCOUNTER — Other Ambulatory Visit: Payer: Self-pay | Admitting: *Deleted

## 2019-12-03 DIAGNOSIS — M79604 Pain in right leg: Secondary | ICD-10-CM

## 2019-12-03 DIAGNOSIS — M79605 Pain in left leg: Secondary | ICD-10-CM

## 2020-01-27 ENCOUNTER — Other Ambulatory Visit: Payer: Self-pay

## 2020-01-27 ENCOUNTER — Ambulatory Visit: Payer: BC Managed Care – PPO | Admitting: Family Medicine

## 2020-01-27 ENCOUNTER — Encounter: Payer: Self-pay | Admitting: Family Medicine

## 2020-01-27 VITALS — BP 142/100 | HR 101 | Temp 98.5°F | Ht 64.0 in | Wt 145.4 lb

## 2020-01-27 DIAGNOSIS — R1013 Epigastric pain: Secondary | ICD-10-CM

## 2020-01-27 DIAGNOSIS — D649 Anemia, unspecified: Secondary | ICD-10-CM

## 2020-01-27 DIAGNOSIS — Z13220 Encounter for screening for lipoid disorders: Secondary | ICD-10-CM

## 2020-01-27 DIAGNOSIS — E519 Thiamine deficiency, unspecified: Secondary | ICD-10-CM

## 2020-01-27 DIAGNOSIS — E538 Deficiency of other specified B group vitamins: Secondary | ICD-10-CM

## 2020-01-27 DIAGNOSIS — E559 Vitamin D deficiency, unspecified: Secondary | ICD-10-CM

## 2020-01-27 DIAGNOSIS — E039 Hypothyroidism, unspecified: Secondary | ICD-10-CM

## 2020-01-27 DIAGNOSIS — G629 Polyneuropathy, unspecified: Secondary | ICD-10-CM | POA: Diagnosis not present

## 2020-01-27 NOTE — Progress Notes (Signed)
Sabrina Gibbs DOB: [birth year] Encounter date: 01/27/2020  This is a 64 y.o. female who presents with Chief Complaint  Patient presents with   Abdominal Pain    patient complains of upper abdominal pain xyears, worse now    History of present illness: States that she was having "tingles" all over before I was her provider. Would also get epigastric pain and would get some contracting in chest. If lying down she doesn't feel it. Activity will make it worse - worse with walking up hills. Continues with tingles. No muscle cramping. abd pain more noticeable and constant than it was before. Tingling never goes away. Chest pain is gone with lying down. No heart racing. Sometimes feels like she has some chest pressure, but not constant/scary; just different.   prilosec did help but didn't make it go away.   Seems better after wine. Not noticing as much. No food/dietary triggers. Not waking her at night. Not bothering her at night.  Not worse after eating.  Seems worst when she is standing up.  Improved with sitting, the past with lying down.  NO changes in stools.   Energy level is great.   No skin changes (just chronic psoriasis, but no change with this)  On chart review, first mention within this providers notes of above symptoms was at transition of care visit in December/2019.  At that time she mentioned getting tingling sensation nearly daily when sitting still.  At that time noted symptoms of been going on for about a year.  On follow-up visit in January/2020, patient stated that she started to have some squeezing sensation across the chest.  At that time she had discussed with gastroenterology and put on Prilosec for 14 days but did not note improvement in symptoms.  She was sent to cardiology after her normal lab work returned.  EKG reviewed by cardiology and incomplete right bundle branch block was thought to be normal variant.  Heart murmur was further evaluated with an echo, revealing  mild aortic valve sclerosis, but otherwise thought to be reassuring.  Return visit (via video) in April/2020 in which she felt that twinges and tingles were happening worse than previous.  At that time there is some concern for brother having muscular disease for which she was requiring injections, but on further questioning today, she has found out that his muscular issues were statin related and not something genetic.  She was sent to neurology for further evaluation given worsening/ongoing symptoms.  She was evaluated in May/06/2018 by Dr. Posey Pronto, who felt that paresthesias were migratory and nonspecific.  I did order some follow-up blood work as well as a brain MRI.  Blood work consisted of MMA, vitamin B1, folate, and copper.  B1 was found to be deficient at that time, copper was found to be slightly elevated, an MRI of the brain was normal.   No Known Allergies Current Meds  Medication Sig   cholecalciferol (VITAMIN D) 1000 units tablet Take 1,000 Units by mouth daily.   fluticasone (FLONASE) 50 MCG/ACT nasal spray Place 2 sprays into both nostrils daily.   levothyroxine (SYNTHROID) 50 MCG tablet Take 1 tablet (50 mcg total) by mouth daily.    Review of Systems  Constitutional: Negative for activity change, appetite change, chills, fatigue, fever and unexpected weight change.  HENT: Negative for congestion.   Eyes: Negative for visual disturbance.  Respiratory: Positive for chest tightness. Negative for cough, shortness of breath and wheezing.   Cardiovascular: Negative for chest pain, palpitations  and leg swelling.  Gastrointestinal: Positive for abdominal pain (epigastric only). Negative for anal bleeding, blood in stool, constipation, diarrhea, nausea, rectal pain and vomiting. Abdominal distention: sometimes feels bloated.  Genitourinary: Negative for difficulty urinating, dysuria, flank pain, frequency and hematuria.  Musculoskeletal: Negative for back pain.  Skin: Negative for rash  (nothing new).  Neurological: Negative for dizziness, tremors (baseline hand tremors; chronic; nothing new), weakness, light-headedness and headaches.    Objective:  BP (!) 142/100 (BP Location: Left Arm, Patient Position: Sitting, Cuff Size: Normal)    Pulse (!) 101    Temp 98.5 F (36.9 C) (Oral)    Ht 5\' 4"  (1.626 m)    Wt 145 lb 6.4 oz (66 kg)    LMP 10/19/2010    SpO2 96%    BMI 24.96 kg/m   Weight: 145 lb 6.4 oz (66 kg)   BP Readings from Last 3 Encounters:  01/27/20 (!) 142/100  11/27/19 133/90  06/07/19 128/80   Wt Readings from Last 3 Encounters:  01/27/20 145 lb 6.4 oz (66 kg)  11/27/19 144 lb (65.3 kg)  06/07/19 143 lb (64.9 kg)    Physical Exam Constitutional:      General: She is not in acute distress.    Appearance: She is well-developed. She is not diaphoretic.  HENT:     Head: Normocephalic and atraumatic.     Right Ear: External ear normal.     Left Ear: External ear normal.  Eyes:     Conjunctiva/sclera: Conjunctivae normal.     Pupils: Pupils are equal, round, and reactive to light.  Neck:     Thyroid: No thyromegaly.  Cardiovascular:     Rate and Rhythm: Normal rate and regular rhythm.     Heart sounds: Normal heart sounds. No murmur heard.  No friction rub. No gallop.   Pulmonary:     Effort: Pulmonary effort is normal. No respiratory distress.     Breath sounds: Normal breath sounds. No wheezing or rales.  Chest:     Comments: No pain with pressure on chest wall. Abdominal:     General: Abdomen is flat. Bowel sounds are normal.     Palpations: Abdomen is soft.     Tenderness: There is no abdominal tenderness. There is no right CVA tenderness, left CVA tenderness, guarding or rebound.     Comments: No tenderness reproduced on today's exam.  Endorses epigastric area as location of discomfort.  Musculoskeletal:     Cervical back: Neck supple.  Lymphadenopathy:     Cervical: No cervical adenopathy.  Skin:    General: Skin is warm and dry.   Neurological:     Mental Status: She is alert and oriented to person, place, and time.     Cranial Nerves: No cranial nerve deficit.     Motor: No abnormal muscle tone.     Deep Tendon Reflexes: Reflexes normal.     Reflex Scores:      Tricep reflexes are 2+ on the right side and 2+ on the left side.      Bicep reflexes are 2+ on the right side and 2+ on the left side.      Brachioradialis reflexes are 2+ on the right side and 2+ on the left side.      Patellar reflexes are 2+ on the right side and 2+ on the left side. Psychiatric:        Behavior: Behavior normal.     Assessment/Plan  1. Neuropathy Neuropathic symptoms continue,  so we will start with blood work today and consider further evaluation pending these results.  Pain right now is localized to epigastric area.  Continues with chest pressure.  Will determine further evaluation or follow-up plan pending lab results. - Comprehensive metabolic panel; Future - Ceruloplasmin; Future - Magnesium; Future - Heavy Metals Panel, Blood; Future - Heavy Metals Panel, Blood - Magnesium - Ceruloplasmin - Comprehensive metabolic panel  2. Epigastric abdominal pain See above. - Amylase; Future - Lipase; Future - Lipase - Amylase  3. B12 deficiency  - Folate; Future - Folate  4. Anemia, unspecified type History of anemia.  We will recheck blood work today. - CBC with Differential/Platelet; Future - Vitamin B12; Future - Iron, TIBC and Ferritin Panel; Future - Folate; Future - Folate - Iron, TIBC and Ferritin Panel - Vitamin B12 - CBC with Differential/Platelet  5. Lipid screening - Lipid panel; Future - Lipid panel  6. Vitamin D deficiency - VITAMIN D 25 Hydroxy (Vit-D Deficiency, Fractures); Future - VITAMIN D 25 Hydroxy (Vit-D Deficiency, Fractures)  7. Hypothyroidism, unspecified type - TSH; Future - T4, free; Future - T3, free; Future - T3, free - T4, free - TSH  8. Thiamine deficiency - Vitamin B1;  Future - Vitamin B1    Return for pending bloodwork. Time with patient, chart review, charting time over 45 minutes.  Micheline Rough, MD

## 2020-01-30 LAB — FOLATE: Folate: 15.1 ng/mL

## 2020-01-30 LAB — CBC WITH DIFFERENTIAL/PLATELET
Absolute Monocytes: 448 cells/uL (ref 200–950)
Basophils Absolute: 32 cells/uL (ref 0–200)
Basophils Relative: 0.6 %
Eosinophils Absolute: 97 cells/uL (ref 15–500)
Eosinophils Relative: 1.8 %
HCT: 42 % (ref 35.0–45.0)
Hemoglobin: 14.5 g/dL (ref 11.7–15.5)
Lymphs Abs: 1415 cells/uL (ref 850–3900)
MCH: 32.2 pg (ref 27.0–33.0)
MCHC: 34.5 g/dL (ref 32.0–36.0)
MCV: 93.3 fL (ref 80.0–100.0)
MPV: 9.9 fL (ref 7.5–12.5)
Monocytes Relative: 8.3 %
Neutro Abs: 3407 cells/uL (ref 1500–7800)
Neutrophils Relative %: 63.1 %
Platelets: 221 10*3/uL (ref 140–400)
RBC: 4.5 10*6/uL (ref 3.80–5.10)
RDW: 12.4 % (ref 11.0–15.0)
Total Lymphocyte: 26.2 %
WBC: 5.4 10*3/uL (ref 3.8–10.8)

## 2020-01-30 LAB — COMPREHENSIVE METABOLIC PANEL
AG Ratio: 2 (calc) (ref 1.0–2.5)
ALT: 15 U/L (ref 6–29)
AST: 22 U/L (ref 10–35)
Albumin: 4.9 g/dL (ref 3.6–5.1)
Alkaline phosphatase (APISO): 87 U/L (ref 37–153)
BUN: 13 mg/dL (ref 7–25)
CO2: 27 mmol/L (ref 20–32)
Calcium: 9.5 mg/dL (ref 8.6–10.4)
Chloride: 102 mmol/L (ref 98–110)
Creat: 0.55 mg/dL (ref 0.50–0.99)
Globulin: 2.4 g/dL (calc) (ref 1.9–3.7)
Glucose, Bld: 86 mg/dL (ref 65–99)
Potassium: 3.6 mmol/L (ref 3.5–5.3)
Sodium: 141 mmol/L (ref 135–146)
Total Bilirubin: 0.6 mg/dL (ref 0.2–1.2)
Total Protein: 7.3 g/dL (ref 6.1–8.1)

## 2020-01-30 LAB — HEAVY METALS PANEL, BLOOD
Arsenic: <10 mcg/L (ref <23)
Lead: 2 ug/dL (ref <5)
Mercury, B: <5 mcg/L (ref 0–10)

## 2020-01-30 LAB — LIPID PANEL
Cholesterol: 230 mg/dL — ABNORMAL HIGH (ref <200)
HDL: 108 mg/dL (ref >=50)
LDL Cholesterol (Calc): 98 mg/dL (calc)
Non-HDL Cholesterol (Calc): 122 mg/dL (calc) (ref <130)
Total CHOL/HDL Ratio: 2.1 (calc) (ref <5.0)
Triglycerides: 141 mg/dL (ref <150)

## 2020-01-30 LAB — CERULOPLASMIN: Ceruloplasmin: 40 mg/dL (ref 18–53)

## 2020-01-30 LAB — IRON,TIBC AND FERRITIN PANEL
%SAT: 34 % (calc) (ref 16–45)
Ferritin: 175 ng/mL (ref 16–288)
Iron: 133 ug/dL (ref 45–160)
TIBC: 397 mcg/dL (calc) (ref 250–450)

## 2020-01-30 LAB — VITAMIN D 25 HYDROXY (VIT D DEFICIENCY, FRACTURES): Vit D, 25-Hydroxy: 63 ng/mL (ref 30–100)

## 2020-01-30 LAB — VITAMIN B12: Vitamin B-12: 325 pg/mL (ref 200–1100)

## 2020-01-30 LAB — LIPASE: Lipase: 33 U/L (ref 7–60)

## 2020-01-30 LAB — VITAMIN B1: Vitamin B1 (Thiamine): 8 nmol/L (ref 8–30)

## 2020-01-30 LAB — AMYLASE: Amylase: 23 U/L (ref 21–101)

## 2020-01-30 LAB — TSH: TSH: 2.28 mIU/L (ref 0.40–4.50)

## 2020-01-30 LAB — MAGNESIUM: Magnesium: 1.9 mg/dL (ref 1.5–2.5)

## 2020-01-30 LAB — T3, FREE: T3, Free: 2.9 pg/mL (ref 2.3–4.2)

## 2020-01-30 LAB — T4, FREE: Free T4: 1.4 ng/dL (ref 0.8–1.8)

## 2020-02-03 ENCOUNTER — Encounter: Payer: Self-pay | Admitting: Family Medicine

## 2020-02-03 NOTE — Addendum Note (Signed)
Addended by: Lahoma Crocker A on: 02/03/2020 03:00 PM   Modules accepted: Orders

## 2020-02-05 ENCOUNTER — Encounter: Payer: Self-pay | Admitting: Family Medicine

## 2020-02-06 ENCOUNTER — Encounter: Payer: Self-pay | Admitting: Nurse Practitioner

## 2020-02-07 ENCOUNTER — Encounter: Payer: Self-pay | Admitting: Family Medicine

## 2020-02-09 ENCOUNTER — Encounter: Payer: Self-pay | Admitting: Family Medicine

## 2020-02-09 DIAGNOSIS — R1013 Epigastric pain: Secondary | ICD-10-CM

## 2020-02-10 NOTE — Telephone Encounter (Signed)
I have sent another message regarding this.

## 2020-02-10 NOTE — Telephone Encounter (Signed)
JoAnne -   Uncertain if [facility] has any openings that we may be able to help get her in sooner?  Otherwise, I would recommend calling Dr. Mar Daring Mann's office to see about wait time. OK for referral there if desired. Let me know if sooner appointment not available with these attempts!

## 2020-03-17 ENCOUNTER — Encounter: Payer: Self-pay | Admitting: Nurse Practitioner

## 2020-03-17 ENCOUNTER — Ambulatory Visit: Payer: BC Managed Care – PPO | Admitting: Nurse Practitioner

## 2020-03-17 VITALS — BP 142/92 | HR 88 | Ht 64.0 in | Wt 147.1 lb

## 2020-03-17 DIAGNOSIS — Z1211 Encounter for screening for malignant neoplasm of colon: Secondary | ICD-10-CM

## 2020-03-17 DIAGNOSIS — R1013 Epigastric pain: Secondary | ICD-10-CM

## 2020-03-17 NOTE — Patient Instructions (Addendum)
If you are age 64 or older, your body mass index should be between 23-30. Your Body mass index is 25.25 kg/m. If this is out of the aforementioned range listed, please consider follow up with your Primary Care Provider.  If you are age 62 or younger, your body mass index should be between 19-25. Your Body mass index is 25.25 kg/m. If this is out of the aformentioned range listed, please consider follow up with your Primary Care Provider.   Use Salon Pas patches as directed for 1 week.  Take ibuprofen 600mg  three times daily for 1 week  Continue with Nexium while on ibuprofen  Due to recent changes in healthcare laws, you may see the results of your imaging and laboratory studies on MyChart before your provider has had a chance to review them.  We understand that in some cases there may be results that are confusing or concerning to you. Not all laboratory results come back in the same time frame and the provider may be waiting for multiple results in order to interpret others.  Please give [place] 48 hours in order for your provider to thoroughly review all the results before contacting the office for clarification of your results.   Thank you for choosing [facility] Noralyn Pick, CRNP  [phone]

## 2020-03-17 NOTE — Progress Notes (Signed)
ASSESSMENT AND PLAN    # Progressive epigastric pain over 1.5 years.  --Interestingly pain is only noticeable when walking or sometimes bending over. Pain sounds muscullskeletal given relationsihip to movement.  --Will try course of NSAIDS in the form of Ibuprofen 600 mg TID x 1 week. Advised to continue Nexium while taking Advil. She will apply SalonPas (lidoderm) patch to affected area as directed x 1 week.  --We discussed EGD, I think it will be low yield but the pain is progressive and needs further if no response to above. Additionally,  she has had intermittent chest pain with negative cardiac evaluation. The risks and benefits of EGD were discussed and the patient agrees to proceed.  --Patient will cancel EGD if there if she does improve with above.   --If EGD negative, consider CT scan.   # Colon cancer screening.  --2013 colonoscopy by Dr. Collene Mares - normal per patient - 2018 Cologuard by Dr. Collene Mares - negative per patient.  --Will request those records   HISTORY OF PRESENT ILLNESS     Primary Gastroenterologist : new  Chief Complaint : upper abdominal pain  Sabrina Gibbs is a 64 y.o. 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 She is referred by PCP for abdominal pain.   Patient is here with complaints of epigastric pain for about 1.5 years. Initially the pain was intermittent but now occurs more frequently and is more , severe. The pain is mainly noticeable when walking, sometimes when bending over. Rarely feels the pain when sitting or sleeping.. The pain is transient, occurs off and on while walking. The pain doesn't radiate through to her back. She has no associated nausea / vomiting. When pain first started patient was prescribed Prilosec x 2 weeks but had no noticeable improvement. She decided to try Nexium about a month ago but again there hasn't been any improvement.  No GERD symptoms. Her weight has been  stable, bowel movements normal. Patient has no known back problems.   Patient sometimes gets non-exertional chest discomfort. She describes it as a squeezing discomfort.  No associated SOB. PCP referred her to Cardiology, workup apparently negative.    Patient has also been undergoing evaluation for generalized tingling sensation. Per PCP's latest office note patient was : " sent to neurology for further evaluation given worsening/ongoing symptoms.  She was evaluated in May/06/2018 by Dr. Posey Pronto, who felt that paresthesias were migratory and nonspecific.  I did order some follow-up blood work as well as a brain MRI.  Blood work consisted of MMA, vitamin B1, folate, and copper.  B1 was found to be deficient at that time, copper was found to be slightly elevated, an MRI of the brain was normal."    Previous Endoscopic Evaluations / Pertinent Studies:   2013 Dr. Collene Mares - normal per patient 2018 Dr. Collene Mares - Cologuard negative per patient  Past Medical History:  Diagnosis Date  . Allergy to poison ivy    carried prednisone with her when she has possibility of exposure.   . Colon polyp, hyperplastic 2008  . Fibroid   . Thyroid disease     Past Surgical History:  Procedure Laterality Date  . BASAL CELL CARCINOMA EXCISION     nose; follows with dermatology  . KNEE ARTHROSCOPY Left 1999   Family History  Problem Relation Age of Onset  . High blood pressure Sister   . Other Brother  muscular neuropathy secondary to statin; gets infusions  . Diabetes Brother   . Heart disease Father   . Hypertension Father   . Heart disease Mother   . Atrial fibrillation Mother   . Arrhythmia Mother   . Neuropathy Other        diabetic related   Social History   Tobacco Use  . Smoking status: Never Smoker  . Smokeless tobacco: Never Used  Vaping Use  . Vaping Use: Never used  Substance Use Topics  . Alcohol use: Yes    Alcohol/week: 10.0 standard drinks    Types: 10 Glasses of wine per week   . Drug use: No   Current Outpatient Medications  Medication Sig Dispense Refill  . cholecalciferol (VITAMIN D) 1000 units tablet Take 1,000 Units by mouth daily.    . fluticasone (FLONASE) 50 MCG/ACT nasal spray Place 2 sprays into both nostrils daily. 16 g 6  . levothyroxine (SYNTHROID) 50 MCG tablet Take 1 tablet (50 mcg total) by mouth daily. 90 tablet 4   No current facility-administered medications for this visit.   No Known Allergies   Review of Systems: All systems reviewed and negative except where noted in HPI.   PHYSICAL EXAM :    Wt Readings from Last 3 Encounters:  01/27/20 145 lb 6.4 oz (66 kg)  11/27/19 144 lb (65.3 kg)  06/07/19 143 lb (64.9 kg)    LMP 10/19/2010  Constitutional:  Pleasant female in no acute distress. Psychiatric: Normal mood and affect. Behavior is normal. EENT: Pupils normal.  Conjunctivae are normal. No scleral icterus. Neck supple.  Cardiovascular: Normal rate, regular rhythm. No edema Pulmonary/chest: Effort normal and breath sounds normal. No wheezing, rales or rhonchi. Abdominal: Soft, nondistended, nontender. Bowel sounds active throughout. There are no masses palpable. No hepatomegaly. Negative Carnett's sign.  Neurological: Alert and oriented to person place and time. Skin: Skin is warm and dry. No rashes noted.  Tye Savoy, NP  03/17/2020, 9:16 AM  Cc:  Referring Provider Caren Macadam, MD

## 2020-03-20 NOTE — Progress Notes (Signed)
Attending Physician's Attestation   I have reviewed the chart.   I agree with the Advanced Practitioner's note, impression, and recommendations with any updates as below.    Enda Santo Mansouraty, MD [facility] Advanced [facility]oscopy Office # [phone]  

## 2020-03-25 ENCOUNTER — Encounter: Payer: Self-pay | Admitting: Vascular Surgery

## 2020-03-25 ENCOUNTER — Other Ambulatory Visit: Payer: Self-pay

## 2020-03-25 ENCOUNTER — Ambulatory Visit (HOSPITAL_COMMUNITY)
Admission: RE | Admit: 2020-03-25 | Discharge: 2020-03-25 | Disposition: A | Discharge status: Home / Self Care | Payer: BC Managed Care – PPO | Source: Ambulatory Visit | Attending: Vascular Surgery | Admitting: Vascular Surgery

## 2020-03-25 ENCOUNTER — Ambulatory Visit: Payer: BC Managed Care – PPO | Admitting: Vascular Surgery

## 2020-03-25 VITALS — BP 162/93 | HR 77 | Temp 97.7°F | Resp 16 | Ht 64.0 in | Wt 148.3 lb

## 2020-03-25 DIAGNOSIS — M79605 Pain in left leg: Secondary | ICD-10-CM | POA: Diagnosis present

## 2020-03-25 DIAGNOSIS — I83813 Varicose veins of bilateral lower extremities with pain: Secondary | ICD-10-CM

## 2020-03-25 DIAGNOSIS — M79604 Pain in right leg: Secondary | ICD-10-CM | POA: Diagnosis present

## 2020-03-25 NOTE — Progress Notes (Signed)
Patient is a 64 year old female who returns for follow-up today.  She was last seen June 2021.  At that time she was complaining of some pain itching and some irritation of her clusters of spider type varicosities in her left and right lower extremity.  She has been wearing compression stockings and has not really noticed much change.  However, she does admit that she has not worn them every single day due to the heat.  Review of systems: She has no shortness of breath.  She has no chest pain.  She is currently undergoing GI evaluation and endoscopy for abdominal pain.  Physical exam:  Vitals:   03/25/20 1344  BP: (!) 162/93  Pulse: 77  Resp: 16  Temp: 97.7 F (36.5 C)  TempSrc: Temporal  SpO2: 99%  Weight: 148 lb 4.8 oz (67.3 kg)  Height: 5\' 4"  (1.626 m)    Extremities: Few scattered spider type varicosities mid leg lateral calf bilaterally  Data: Patient had a venous reflux exam bilaterally today.  This did show some scattered areas of reflux in the greater saphenous vein.  However vein diameter was fairly small 2 mm or less in most locations.  He had mild deep vein reflux on the left and competent veins in the right deep system.  Assessment: Bilateral spider type varicosities with itching and irritation.  Although she has reflux in her greater saphenous vein the vein diameter is fairly small and not large enough to consider for laser ablation.  Plan: Patient will consider whether or not she wishes to have sclerotherapy in the near future and call our vein nurse regarding this.  Otherwise she will follow up on an as-needed basis.  Ruta Hinds, MD [facility] Office: [phone]

## 2020-03-26 ENCOUNTER — Ambulatory Visit (AMBULATORY_SURGERY_CENTER): Payer: BC Managed Care – PPO | Admitting: Gastroenterology

## 2020-03-26 ENCOUNTER — Other Ambulatory Visit: Payer: Self-pay

## 2020-03-26 ENCOUNTER — Encounter: Payer: Self-pay | Admitting: Gastroenterology

## 2020-03-26 VITALS — BP 164/93 | HR 69 | Temp 97.1°F | Resp 16 | Ht 64.0 in | Wt 147.0 lb

## 2020-03-26 DIAGNOSIS — R1013 Epigastric pain: Secondary | ICD-10-CM

## 2020-03-26 DIAGNOSIS — K2970 Gastritis, unspecified, without bleeding: Secondary | ICD-10-CM

## 2020-03-26 DIAGNOSIS — K222 Esophageal obstruction: Secondary | ICD-10-CM | POA: Diagnosis not present

## 2020-03-26 DIAGNOSIS — R0789 Other chest pain: Secondary | ICD-10-CM

## 2020-03-26 DIAGNOSIS — K449 Diaphragmatic hernia without obstruction or gangrene: Secondary | ICD-10-CM

## 2020-03-26 DIAGNOSIS — K3189 Other diseases of stomach and duodenum: Secondary | ICD-10-CM

## 2020-03-26 HISTORY — PX: UPPER GASTROINTESTINAL ENDOSCOPY: SHX188

## 2020-03-26 MED ORDER — SODIUM CHLORIDE 0.9 % IV SOLN: 500.0000 mL | Freq: Once | INTRAVENOUS | Status: DC

## 2020-03-26 NOTE — Progress Notes (Signed)
Called to room to assist during endoscopic procedure.  Patient ID and intended procedure confirmed with present staff. Received instructions for my participation in the procedure from the performing physician.  

## 2020-03-26 NOTE — Progress Notes (Signed)
Report to PACU, RN, vss, BBS= Clear.  

## 2020-03-26 NOTE — Patient Instructions (Signed)
Handout provided on gastritis and hiatal hernia.   YOU HAD AN ENDOSCOPIC PROCEDURE TODAY AT [facility]:   Refer to the procedure report that was given to you for any specific questions about what was found during the examination.  If the procedure report does not answer your questions, please call your gastroenterologist to clarify.  If you requested that your care partner not be given the details of your procedure findings, then the procedure report has been included in a sealed envelope for you to review at your convenience later.  YOU SHOULD EXPECT: Some feelings of bloating in the abdomen. Passage of more gas than usual.  Walking can help get rid of the air that was put into your GI tract during the procedure and reduce the bloating. If you had a lower endoscopy (such as a colonoscopy or flexible sigmoidoscopy) you may notice spotting of blood in your stool or on the toilet paper. If you underwent a bowel prep for your procedure, you may not have a normal bowel movement for a few days.  Please Note:  You might notice some irritation and congestion in your nose or some drainage.  This is from the oxygen used during your procedure.  There is no need for concern and it should clear up in a day or so.  SYMPTOMS TO REPORT IMMEDIATELY:   Following upper endoscopy (EGD)  Vomiting of blood or coffee ground material  New chest pain or pain under the shoulder blades  Painful or persistently difficult swallowing  New shortness of breath  Fever of 100F or higher  Black, tarry-looking stools  For urgent or emergent issues, a gastroenterologist can be reached at any hour by calling [phone]. Do not use MyChart messaging for urgent concerns.    DIET:  We do recommend a small meal at first, but then you may proceed to your regular diet.  Drink plenty of fluids but you should avoid alcoholic beverages for 24 hours.  ACTIVITY:  You should plan to take it easy for the rest of today  and you should NOT DRIVE or use heavy machinery until tomorrow (because of the sedation medicines used during the test).    FOLLOW UP: Our staff will call the number listed on your records 48-72 hours following your procedure to check on you and address any questions or concerns that you may have regarding the information given to you following your procedure. If we do not reach you, we will leave a message.  We will attempt to reach you two times.  During this call, we will ask if you have developed any symptoms of COVID 19. If you develop any symptoms (ie: fever, flu-like symptoms, shortness of breath, cough etc.) before then, please call [phone].  If you test positive for Covid 19 in the 2 weeks post procedure, please call and report this information to [place].    If any biopsies were taken you will be contacted by phone or by letter within the next 1-3 weeks.  Please call us at [phone] if you have not heard about the biopsies in 3 weeks.    SIGNATURES/CONFIDENTIALITY: You and/or your care partner have signed paperwork which will be entered into your electronic medical record.  These signatures attest to the fact that that the information above on your After Visit Summary has been reviewed and is understood.  Full responsibility of the confidentiality of this discharge information lies with you and/or your care-partner.

## 2020-03-26 NOTE — Op Note (Signed)
[facility] Patient Name: Sabrina Gibbs Procedure Date: 03/26/2020 1:28 PM MRN: [id number] Endoscopist: Justice Britain , MD Age: 64 Referring MD:  Date of Birth: 08/23/1955 Gender: Female Account #: [id number] Procedure:                Upper GI endoscopy Indications:              Epigastric abdominal pain, Chest pain (non                            cardiac), Concern for possible musculoskeletal                            component Medicines:                Monitored Anesthesia Care Procedure:                Pre-Anesthesia Assessment:                           - Prior to the procedure, a History and Physical                            was performed, and patient medications and                            allergies were reviewed. The patient's tolerance of                            previous anesthesia was also reviewed. The risks                            and benefits of the procedure and the sedation                            options and risks were discussed with the patient.                            All questions were answered, and informed consent                            was obtained. Prior Anticoagulants: The patient has                            taken no previous anticoagulant or antiplatelet                            agents except for NSAID medication. ASA Grade                            Assessment: II - A patient with mild systemic                            disease. After reviewing the risks and benefits,  the patient was deemed in satisfactory condition to                            undergo the procedure.                           After obtaining informed consent, the endoscope was                            passed under direct vision. Throughout the                            procedure, the patient's blood pressure, pulse, and                            oxygen saturations were monitored continuously. The                             Endoscope was introduced through the mouth, and                            advanced to the second part of duodenum. The upper                            GI endoscopy was accomplished without difficulty.                            The patient tolerated the procedure. Scope In: Scope Out: Findings:                 No gross mucosal lesions were noted in the entire                            esophagus. Biopsies were taken with a cold forceps                            for histology to rule out EoE/LoE.                           The Z-line was irregular and was found 40 cm from                            the incisors.                           A widely patent and non-obstructing Schatzki ring                            was found at the gastroesophageal junction.                           A 1 cm hiatal hernia was present.                           Striped mildly erythematous mucosa without  bleeding                            was found in the gastric antrum.                           No other gross lesions were noted in the entire                            examined stomach. Biopsies were taken with a cold                            forceps for histology and Helicobacter pylori                            testing.                           No gross lesions were noted in the duodenal bulb,                            in the first portion of the duodenum and in the                            second portion of the duodenum. Biopsies were taken                            with a cold forceps for histology. Complications:            No immediate complications. Estimated Blood Loss:     Estimated blood loss was minimal. Impression:               - No gross mucosal lesions in esophagus - biopsied.                            Z-line irregular, 40 cm from the incisors. Widely                            patent and non-obstructing Schatzki ring.                           - 1 cm hiatal hernia.                            - Erythematous mucosa in the antrum. No other gross                            lesions in the stomach. Biopsied.                           - No gross lesions in the duodenal bulb, in the                            first portion of the duodenum and in the second  portion of the duodenum. Biopsied. Recommendation:           - The patient will be observed post-procedure,                            until all discharge criteria are met.                           - Discharge patient to home.                           - Patient has a contact number available for                            emergencies. The signs and symptoms of potential                            delayed complications were discussed with the                            patient. Return to normal activities tomorrow.                            Written discharge instructions were provided to the                            patient.                           - Resume previous diet.                           - Continue present medications.                           - Await pathology results.                           - If evidence of HP will need treatment. If                            significant gastritis noted, consider increased PPI                            dosing.                           - Follow up in clinic with PA Guenther or myself                            based on patient's symptoms/clincal response.                           - The findings and recommendations were discussed                            with the patient.  Justice Britain, MD 03/26/2020 2:02:53 PM

## 2020-03-26 NOTE — Progress Notes (Signed)
Vital signs checked by:CW ? ?The medical and surgical history was reviewed and verified with the patient. ? ?

## 2020-03-30 ENCOUNTER — Telehealth: Payer: Self-pay | Admitting: *Deleted

## 2020-03-30 NOTE — Telephone Encounter (Signed)
°  Follow up Call-  Call back number 03/26/2020  Post procedure Call Back phone  # [phone]  Permission to leave phone message Yes  Some recent data might be hidden   [facility]

## 2020-03-30 NOTE — Telephone Encounter (Signed)
°  Follow up Call-  Call back number 03/26/2020  Post procedure Call Back phone  # [phone]  Permission to leave phone message Yes  Some recent data might be hidden     Patient questions:  Do you have a fever, pain , or abdominal swelling? No. Pain Score  0 *  Have you tolerated food without any problems? Yes.    Have you been able to return to your normal activities? Yes.    Do you have any questions about your discharge instructions: Diet   No. Medications  No. Follow up visit  No.  Do you have questions or concerns about your Care? No.  Actions: * If pain score is 4 or above: No action needed, pain <4.  1. Have you developed a fever since your procedure? no  2.   Have you had an respiratory symptoms (SOB or cough) since your procedure? no  3.   Have you tested positive for COVID 19 since your procedure no  4.   Have you had any family members/close contacts diagnosed with the COVID 19 since your procedure?  no   If yes to any of these questions please route to Joylene John, RN and Joella Prince, RN

## 2020-04-03 ENCOUNTER — Encounter: Payer: Self-pay | Admitting: Gastroenterology

## 2020-04-07 ENCOUNTER — Telehealth: Payer: Self-pay | Admitting: Nurse Practitioner

## 2020-04-08 ENCOUNTER — Other Ambulatory Visit: Payer: Self-pay

## 2020-04-08 MED ORDER — ESOMEPRAZOLE MAGNESIUM 40 MG PO CPDR
40.0000 mg | DELAYED_RELEASE_CAPSULE | Freq: Two times a day (BID) | ORAL | 6 refills | Status: DC
Start: 2020-04-08 — End: 2021-03-01

## 2020-04-08 NOTE — Telephone Encounter (Signed)
The pt has been given the information in the letter and prescription sent for BID Nexium.  She will call back to make appt for follow up    04/03/2020 MRN: [id number]   Cherita Hebel [place] [address]  Dear Ms. Janann Colonel,  I am writing to inform you that the biopsies taken during your recent endoscopic examination showed:   Diagnosis 1. Surgical [P], duodenal biopsies - DUODENAL MUCOSA WITH NO SPECIFIC HISTOPATHOLOGIC CHANGES - NEGATIVE FOR INCREASED INTRAEPITHELIAL LYMPHOCYTES OR VILLOUS ARCHITECTURAL CHANGES 2. Surgical [P], gastric biopsies - GASTRIC ANTRAL MUCOSA WITH MILD NONSPECIFIC REACTIVE GASTROPATHY - GASTRIC OXYNTIC MUCOSA WITH NO SPECIFIC HISTOPATHOLOGIC CHANGES - WARTHIN STARRY STAIN IS NEGATIVE FOR HELICOBACTER PYLORI 3. Surgical [P], esophageal biopsies - ESOPHAGEAL SQUAMOUS MUCOSA WITH NO SPECIFIC HISTOPATHOLOGIC CHANGES - NEGATIVE FOR INCREASED INTRAEPITHELIAL EOSINOPHILS   What does this all mean? Your small intestine biopsies showed no evidence of celiac disease or precancerous changes. Your stomach biopsies showed some mild reactive changes known as gastropathy.  There is no evidence of H. pylori infection or precancerous changes. Your esophagus biopsies showed no evidence of eosinophilic or lymphocytic esophagitis. It is not clear if you will have a significant improvement in your symptoms if we increase your acid reducing medications.  We certainly can do that and double up for the next 2 to 4 weeks to see if it makes any difference or wait to see [place] back in clinic and determine if additional work-up is required for management of therapies. We will work on scheduling the follow-up in clinic with [place] or myself.  Please call us at Dept: [phone] if you have persistent problems or have questions about your condition that have not been fully answered at this time.  Sincerely,  Irving Copas., MD

## 2020-04-10 ENCOUNTER — Telehealth: Payer: Self-pay

## 2020-04-10 NOTE — Telephone Encounter (Signed)
Returned pt's call; left message for her to call us back.

## 2020-04-23 ENCOUNTER — Telehealth: Payer: Self-pay

## 2020-04-23 NOTE — Telephone Encounter (Signed)
Left pt a VM to call back with return call back number and extension.

## 2020-05-12 ENCOUNTER — Other Ambulatory Visit: Payer: Self-pay

## 2020-05-12 ENCOUNTER — Ambulatory Visit: Payer: Self-pay

## 2020-05-12 ENCOUNTER — Ambulatory Visit: Payer: BC Managed Care – PPO

## 2020-05-12 DIAGNOSIS — I8393 Asymptomatic varicose veins of bilateral lower extremities: Secondary | ICD-10-CM

## 2020-05-12 NOTE — Progress Notes (Signed)
Treated pt with Asclera 1% administered with a 27g butterfly.  Patient received a total of 2 mL. Very small spider veins and small reticular vein. Pt tolerated well. Easy access. Pt was given post procedure instructions verbally and on handout. Will follow PRN.   Photos: Yes.    Compression stockings applied: Yes.

## 2020-05-13 ENCOUNTER — Encounter: Payer: Self-pay | Admitting: Nurse Practitioner

## 2020-05-13 ENCOUNTER — Other Ambulatory Visit (INDEPENDENT_AMBULATORY_CARE_PROVIDER_SITE_OTHER): Payer: BC Managed Care – PPO

## 2020-05-13 ENCOUNTER — Ambulatory Visit (INDEPENDENT_AMBULATORY_CARE_PROVIDER_SITE_OTHER): Payer: BC Managed Care – PPO | Admitting: Nurse Practitioner

## 2020-05-13 VITALS — BP 164/100 | HR 98 | Ht 64.0 in | Wt 149.0 lb

## 2020-05-13 DIAGNOSIS — R1010 Upper abdominal pain, unspecified: Secondary | ICD-10-CM

## 2020-05-13 LAB — BASIC METABOLIC PANEL
BUN: 13 mg/dL (ref 6–23)
CO2: 28 mEq/L (ref 19–32)
Calcium: 9.6 mg/dL (ref 8.4–10.5)
Chloride: 99 mEq/L (ref 96–112)
Creatinine, Ser: 0.6 mg/dL (ref 0.40–1.20)
GFR: 94.73 mL/min (ref >60.00)
Glucose, Bld: 89 mg/dL (ref 70–99)
Potassium: 3.8 mEq/L (ref 3.5–5.1)
Sodium: 137 mEq/L (ref 135–145)

## 2020-05-13 NOTE — Progress Notes (Signed)
ASSESSMENT AND PLAN    # Persistent mid upper abdominal pain, worse when walking or during other times of increased activity.  Labs and EGD unrevealing.  Pain still seems most compatible with musculoskeletal origin but patient is concerned about more ominous possibilities.  Minimal response to PPI, NSAIDs, and lidocaine patches --Proceed with CT scan of abdomen with contrast.  If CT scan is negative then perhaps PCP can refer her for trigger point injection for abdominal wall pain --She can stop the PPI if no improvement within a couple of weeks  # Colon cancer screening.  --2013 colonoscopy by Dr. Collene Mares - normal per patient - 2018 Cologuard by Dr. Collene Mares - negative per patient  [place]     Primary Gastroenterologist : Justice Britain, MD  Chief Complaint : upper abdominal pain.   Sabrina Gibbs is a 64 y.o. female with PMH / [place] significant for,  but not necessarily limited to: thyroid disease  I saw patient in the office late September for evaluation of upper abdominal pain present for 1.5 years.  CMP, lipase and CBC were normal the month prior. The pain was most noticeable when walking, sometimes bending over.  Pain was felt to be musculoskeletal in nature. Patient had also reported some nonexertional chest discomfort, Cardiology work-up has been unremarkable.  She was advised to try ibuprofen and Salonpas patches.  She had already tried PPI without improvement but was asked to continue them while taking NSAIDS.  It was felt that an EGD would probably be low yield but patient was having progressive pain and wanted to proceed with work-up.  EGD 03/26/2020 unremarkable except for antral erythema. Esophageal biopsies were negative.  Gastric biopsies showed mild nonspecific reactive gastropathy, no H. pylori.  Duodenal biopsies negative.  Patient says that post EGD her PPI was increased to BID.  With PPI, course of ibuprofen and Salonpas patches she is only about 25%  better at best. Some days pain is worse, especially when she is more active.    Previous Endoscopic Evaluations / Pertinent Studies:   Oct 2021 EGD -No gross mucosal lesions in esophagus - biopsied. Z-line irregular, 40 cm from the incisors. Widely patent and non-obstructing Schatzki ring. - 1 cm hiatal hernia. - Erythematous mucosa in the antrum. No other gross lesions in the stomach. Biopsied. - No gross lesions in the duodenal bulb, in the first portion of the duodenum and in the second portion of the duodenum. Biopsied.   Esophageal biopsies were negative.  Gastric biopsies showed mild nonspecific reactive gastropathy.  No H. pylori.  Duodenal biopsies negative    Past Medical History:  Diagnosis Date   Allergy to poison ivy    carried prednisone with her when she has possibility of exposure.    Colon polyp, hyperplastic 2008   Fibroid    Pneumonia    Thyroid disease     Current Medications, Allergies, Past Surgical History, Family History and Social History were reviewed in [employer] record.   Current Outpatient Medications  Medication Sig Dispense Refill   cholecalciferol (VITAMIN D) 1000 units tablet Take 1,000 Units by mouth daily.     fluticasone (FLONASE) 50 MCG/ACT nasal spray Place 2 sprays into both nostrils daily. 16 g 6   ibuprofen (ADVIL) 600 MG tablet Take 600 mg by mouth every 6 (six) hours as needed.     levothyroxine (SYNTHROID) 50 MCG tablet Take 1 tablet (50 mcg total) by mouth daily. 90 tablet 4  esomeprazole (NEXIUM) 40 MG capsule Take 1 capsule (40 mg total) by mouth 2 (two) times daily before a meal. 60 capsule 6   No current facility-administered medications for this visit.    Review of Systems: No chest pain. No shortness of breath. No urinary complaints.   PHYSICAL EXAM :    Wt Readings from Last 3 Encounters:  05/13/20 149 lb (67.6 kg)  03/26/20 147 lb (66.7 kg)  03/25/20 148 lb 4.8 oz (67.3 kg)    BP  (!) 164/100    Pulse 98    Ht 5\' 4"  (1.626 m)    Wt 149 lb (67.6 kg)    LMP 10/19/2010    BMI 25.58 kg/m  Constitutional:  Pleasant female in no acute distress. Psychiatric: Normal mood and affect. Behavior is normal. EENT: Pupils normal.  Conjunctivae are normal. No scleral icterus. Neck supple.  Cardiovascular: Normal rate, regular rhythm. No edema Pulmonary/chest: Effort normal and breath sounds normal. No wheezing, rales or rhonchi. Abdominal: Soft, nondistended, mild localized (superficial) epigastric tenderness. Bowel sounds active throughout. There are no masses palpable. No hepatomegaly. Negative Carnett's sign Neurological: Alert and oriented to person place and time.  Tye Savoy, NP  05/13/2020, 12:20 PM  Cc:   Caren Macadam, MD

## 2020-05-13 NOTE — Patient Instructions (Addendum)
If you are age 64 or older, your body mass index should be between 23-30. Your Body mass index is 25.58 kg/m. If this is out of the aforementioned range listed, please consider follow up with your Primary Care Provider.  If you are age 80 or younger, your body mass index should be between 19-25. Your Body mass index is 25.58 kg/m. If this is out of the aformentioned range listed, please consider follow up with your Primary Care Provider.   Your provider has requested that you go to the basement level for lab work before leaving today. Press "B" on the elevator. The lab is located at the first door on the left as you exit the elevator.  Stop Omeprazole in 2 weeks if not improvement.   You have been scheduled for a CT scan of the abdomen and pelvis at [facility] ([address]---this is in the same building as [employer]).   You are scheduled on Wednesday 05/20/20 at 4 pm. You should arrive 15 minutes prior to your appointment time for registration. Please follow the written instructions below on the day of your exam:  WARNING: IF YOU ARE ALLERGIC TO IODINE/X-RAY DYE, PLEASE NOTIFY RADIOLOGY IMMEDIATELY AT [phone]! YOU WILL BE GIVEN A 13 HOUR PREMEDICATION PREP.  1) Do not eat or drink anything after 12 pm (4 hours prior to your test) 2) You have been given 2 bottles of oral contrast to drink. The solution may taste better if refrigerated, but do NOT add ice or any other liquid to this solution. Shake well before drinking.    Drink 1 bottle of contrast @ 3 pm (1 hour prior to your exam)  You may take any medications as prescribed with a small amount of water, if necessary. If you take any of the following medications: METFORMIN, GLUCOPHAGE, GLUCOVANCE, AVANDAMET, RIOMET, FORTAMET, [place] MET, JANUMET, GLUMETZA or METAGLIP, you MAY be asked to HOLD this medication 48 hours AFTER the exam.  The purpose of you drinking the oral contrast is to aid in the  visualization of your intestinal tract. The contrast solution may cause some diarrhea. Depending on your individual set of symptoms, you may also receive an intravenous injection of x-ray contrast/dye. Plan on being at [facility] for 30 minutes or longer, depending on the type of exam you are having performed.  This test typically takes 30-45 minutes to complete.  If you have any questions regarding your exam or if you need to reschedule, you may call the CT department at [phone] between the hours of 8:00 am and 5:00 pm, Monday-Friday.  ______________________________________________________________

## 2020-05-14 NOTE — Progress Notes (Signed)
Attending Physician's Attestation   I have reviewed the chart.   I agree with the Advanced Practitioner's note, impression, and recommendations with any updates as below.    Youssouf Shipley Mansouraty, MD [facility] Advanced [facility]oscopy Office # [phone]  

## 2020-05-20 ENCOUNTER — Ambulatory Visit (INDEPENDENT_AMBULATORY_CARE_PROVIDER_SITE_OTHER)
Admission: RE | Admit: 2020-05-20 | Discharge: 2020-05-20 | Disposition: A | Discharge status: Home / Self Care | Payer: BC Managed Care – PPO | Source: Ambulatory Visit | Attending: Nurse Practitioner | Admitting: Nurse Practitioner

## 2020-05-20 ENCOUNTER — Other Ambulatory Visit: Payer: Self-pay

## 2020-05-20 DIAGNOSIS — R1010 Upper abdominal pain, unspecified: Secondary | ICD-10-CM

## 2020-05-20 MED ORDER — IOHEXOL 300 MG/ML  SOLN
100.0000 mL | Freq: Once | INTRAMUSCULAR | Status: AC | PRN
  Administered 2020-05-20: 100 mL via INTRAVENOUS

## 2020-05-21 ENCOUNTER — Encounter: Payer: Self-pay | Admitting: Family Medicine

## 2020-05-21 DIAGNOSIS — R1013 Epigastric pain: Secondary | ICD-10-CM

## 2020-05-21 DIAGNOSIS — R079 Chest pain, unspecified: Secondary | ICD-10-CM

## 2020-06-04 ENCOUNTER — Other Ambulatory Visit: Payer: Self-pay

## 2020-06-04 ENCOUNTER — Ambulatory Visit: Payer: BC Managed Care – PPO | Admitting: Family Medicine

## 2020-06-04 ENCOUNTER — Other Ambulatory Visit: Payer: BC Managed Care – PPO

## 2020-06-04 ENCOUNTER — Encounter: Payer: Self-pay | Admitting: Family Medicine

## 2020-06-04 VITALS — BP 132/86 | HR 78 | Temp 98.4°F | Wt 147.8 lb

## 2020-06-04 DIAGNOSIS — Z4802 Encounter for removal of sutures: Secondary | ICD-10-CM

## 2020-06-04 NOTE — Progress Notes (Signed)
Subjective:    Sabrina Gibbs is a 64 y.o. female who obtained a laceration 8 days ago, which required closure with 8 sutures. Mechanism of injury: fall while standing- tripped over a dog crate and hit her chin on the night stand while out of town in [place]. She denies pain, redness, or drainage from the wound. Her last tetanus was 9 years ago,  02/28/2011.  Patient notes increased stress as her mother had a stroke several months ago and her father is in the hospital 2/2 UTI.  The following portions of the patient's history were reviewed and updated as appropriate: allergies, current medications, past family history, past medical history, past social history, past surgical history and problem list.  Review of Systems A comprehensive review of systems was negative.    Objective:    BP 132/86 (BP Location: Left Arm, Patient Position: Sitting, Cuff Size: Normal)   Pulse 78   Temp 98.4 F (36.9 C) (Oral)   Wt 147 lb 12.8 oz (67 kg)   LMP 10/19/2010   SpO2 98%   BMI 25.37 kg/m  Injury exam:  A 3 cm laceration noted on the L chin is healing well with a .5 cm area of wound dehiscence, without evidence of infection.    Assessment:    Laceration is healing well, 0.5 cm area of wound dehiscence, without evidence of infection.    Plan:     1. 8 sutures were removed.  0.5 cm area of wound dehiscence and lateral and a laceration to heal by secondary intention.  Antibacterial ointment applied.  Patient advised scar formation likely. 2. Wound care discussed. 3. Follow up as needed.    Grier Mitts, MD

## 2020-06-06 LAB — C-REACTIVE PROTEIN: CRP: 2.8 mg/L (ref <8.0)

## 2020-06-06 LAB — ANA: Anti Nuclear Antibody (ANA): POSITIVE — AB

## 2020-06-06 LAB — SEDIMENTATION RATE: Sed Rate: 6 mm/h (ref 0–30)

## 2020-06-06 LAB — ANTI-NUCLEAR AB-TITER (ANA TITER): ANA Titer 1: 1:80 {titer} — ABNORMAL HIGH

## 2020-06-09 ENCOUNTER — Other Ambulatory Visit: Payer: Self-pay | Admitting: Obstetrics & Gynecology

## 2020-06-09 DIAGNOSIS — Z1231 Encounter for screening mammogram for malignant neoplasm of breast: Secondary | ICD-10-CM

## 2020-06-15 ENCOUNTER — Ambulatory Visit: Payer: BC Managed Care – PPO | Admitting: Obstetrics & Gynecology

## 2020-06-30 ENCOUNTER — Ambulatory Visit: Payer: BC Managed Care – PPO | Admitting: Obstetrics & Gynecology

## 2020-06-30 NOTE — Progress Notes (Unsigned)
[facility] [address] [place] Phone: [phone] Subjective:   I Sabrina Gibbs am serving as a Education administrator for Dr. Hulan Saas.  This visit occurred during the SARS-CoV-2 public health emergency.  Safety protocols were in place, including screening questions prior to the visit, additional usage of staff PPE, and extensive cleaning of exam room while observing appropriate contact time as indicated for disinfecting solutions.   I'm seeing this patient by the request  of:  Koberlein, Steele Berg, MD  CC: Chronic abdominal pain  [id number]  Sabrina Gibbs is a 65 y.o. female coming in with complaint of abdominal pain. Has seen gastro and neurology. States her body tingles all the time all over.  Patient is seeing neurology for the tingling previously.  Onset- Chronic  Location - midline points to epigastric area. Duration- worse during the day  Character- burn  Aggravating factors- going up hills Reliving factors-no association with food seems better with sitting. Therapies tried- Nexium, patches, xray  Severity- 4/10 at its worse    Recent imaging including CT abdomen with contrast on May 21, 2020.  This was independently visualized by me showing no significant abnormality  Patient did have a EGD done in October 2021 without any significant findings either  Patient is also undergone an echocardiogram in 2020 that was fairly unremarkable except mild sclerosis of the aortic valve and mild dilatation of the ascending aorta.  Patient was seen by cardiology in 2020  Past Medical History:  Diagnosis Date  . Allergy to poison ivy    carried prednisone with her when she has possibility of exposure.   . Colon polyp, hyperplastic 2008  . Fibroid   . Pneumonia   . Thyroid disease    Past Surgical History:  Procedure Laterality Date  . BASAL CELL CARCINOMA EXCISION     nose; follows with dermatology  . KNEE ARTHROSCOPY Left 1999  .  UPPER GASTROINTESTINAL ENDOSCOPY  03/26/2020  . VEIN SURGERY     Social History   Socioeconomic History  . Marital status: Married    Spouse name: Not on file  . Number of children: 0  . Years of education: Not on file  . Highest education level: Not on file  Occupational History  . Occupation: Retired Pharmacist, hospital  Tobacco Use  . Smoking status: Never Smoker  . Smokeless tobacco: Never Used  Vaping Use  . Vaping Use: Never used  Substance and Sexual Activity  . Alcohol use: Yes    Alcohol/week: 10.0 standard drinks    Types: 10 Glasses of wine per week    Comment: 3 per day  . Drug use: No  . Sexual activity: Yes    Partners: Male    Birth control/protection: Post-menopausal  Other Topics Concern  . Not on file  Social History Narrative  . Not on file   Social Determinants of Health   Financial Resource Strain: Not on file  Food Insecurity: Not on file  Transportation Needs: Not on file  Physical Activity: Not on file  Stress: Not on file  Social Connections: Not on file   No Known Allergies Family History  Problem Relation Age of Onset  . High blood pressure Sister   . Other Brother        muscular neuropathy secondary to statin; gets infusions  . Diabetes Brother   . Heart disease Father   . Hypertension Father   . Varicose Veins Father   . Heart disease Mother   .  Atrial fibrillation Mother   . Arrhythmia Mother   . Neuropathy Other        diabetic related  . Varicose Veins Paternal Aunt   . Colon cancer Neg Hx   . Esophageal cancer Neg Hx   . Stomach cancer Neg Hx   . Rectal cancer Neg Hx     Current Outpatient Medications (Endocrine & Metabolic):  .  levothyroxine (SYNTHROID) 50 MCG tablet, Take 1 tablet (50 mcg total) by mouth daily.   Current Outpatient Medications (Respiratory):  .  fluticasone (FLONASE) 50 MCG/ACT nasal spray, Place 2 sprays into both nostrils daily.  Current Outpatient Medications (Analgesics):  .  ibuprofen (ADVIL) 600 MG  tablet, Take 600 mg by mouth every 6 (six) hours as needed.   Current Outpatient Medications (Other):  .  cholecalciferol (VITAMIN D) 1000 units tablet, Take 1,000 Units by mouth daily. Marland Kitchen  esomeprazole (NEXIUM) 40 MG capsule, Take 1 capsule (40 mg total) by mouth 2 (two) times daily before a meal.   Reviewed prior external information including notes and imaging from  primary care provider As well as notes that were available from care everywhere and other healthcare systems.  Past medical history, social, surgical and family history all reviewed in electronic medical record.  No pertanent information unless stated regarding to the chief complaint.   Review of Systems:  No headache, visual changes, nausea, vomiting, diarrhea, constipation, dizziness, , skin rash, fevers, chills, night sweats, weight loss, swollen lymph nodes,, joint swelling, chest pain, shortness of breath, mood changes. POSITIVE muscle aches, body aches or tingling  Objective  Blood pressure (!) 160/90, pulse 88, height 5\' 4"  (1.626 m), weight 149 lb (67.6 kg), last menstrual period 10/19/2010, SpO2 98 %.   General: No apparent distress alert and oriented x3 mood and affect normal, dressed appropriately.  HEENT: Pupils equal, extraocular movements intact  Respiratory: Patient's speak in full sentences and does not appear short of breath  Cardiovascular: No lower extremity edema, non tender, no erythema  Gait normal with good balance and coordination.  Patient's abdominal exam shows the patient is very minimally tender in the epigastric region.  No masses appreciated.  The patient's pain now is about 2 fingerbreadths inferior to the xiphoid process.  Voluntary guarding noted.  No pain anywhere else during exam.  Mild increase in discomfort with Valsalva but no masses appreciated.  Limited musculoskeletal ultrasound was performed and interpreted by Lyndal Pulley  Limited ultrasound in this area of the epigastric region  does show that the rectus sheath and the underlying rectus abdominis with questionable increased hypoechoic changes with mild increase in Doppler flow.  No masses appreciated.  When patient is asked to do Valsalva no true herniation noted.  Very mild bulging noted at the fascial level.  Otherwise fairly unremarkable in this area. Impression: Nonspecific hypoechoic changes very small that could be normal variant versus small rectus fascial injury    Impression and Recommendations:     The above documentation has been reviewed and is accurate and complete Lyndal Pulley, DO

## 2020-07-01 ENCOUNTER — Other Ambulatory Visit: Payer: Self-pay

## 2020-07-01 ENCOUNTER — Ambulatory Visit: Payer: BC Managed Care – PPO | Admitting: Family Medicine

## 2020-07-01 ENCOUNTER — Ambulatory Visit: Payer: BC Managed Care – PPO | Admitting: Gastroenterology

## 2020-07-01 ENCOUNTER — Encounter: Payer: Self-pay | Admitting: Family Medicine

## 2020-07-01 ENCOUNTER — Ambulatory Visit: Payer: Self-pay

## 2020-07-01 VITALS — BP 160/90 | HR 88 | Ht 64.0 in | Wt 149.0 lb

## 2020-07-01 DIAGNOSIS — R1084 Generalized abdominal pain: Secondary | ICD-10-CM | POA: Diagnosis not present

## 2020-07-01 DIAGNOSIS — R109 Unspecified abdominal pain: Secondary | ICD-10-CM | POA: Insufficient documentation

## 2020-07-01 NOTE — Patient Instructions (Addendum)
Good to see you Continue Prilosec Arnica lotion 2 times daily in the area isometrics 2-3 times a week See me again in 6 weeks if no improvement we can talk about PT

## 2020-07-01 NOTE — Assessment & Plan Note (Addendum)
Patient is having more of abdominal wall pain.  Seems to be in the mid epigastric region.  Differential is quite broad but patient has had work-up already including gastroenterology, neurology and over 1 year ago did have a cardiac work-up.  Patient on ultrasound does have an area that seems to potentially have a fascial injury in this area.  Mild increase in Doppler flow.  Mild increase in hypoechoic changes.  Can potentially go with the differential including a small sports hernia.  With Valsalva I did not see any true hernia present.  This is not perfectly correspond with patient's symptoms but we can try treating with patient already having significant work-up with no other findings at this time.  Patient does do a lot of heavy lifting with her husband being disabled.  This could have contributed to multiple small tears.  I do not see a very specific tear but could go with a very small fascial injury.  Patient given different choices for treatment options at this time.  We discussed that the patient was sent here for the possibility of a cutaneous nerve impingement and would consider the possibility injection but very rare for it to be midline.  At this point she declined any injection which I think is a good idea.  Patient also declined formal physical therapy at this point but would like to follow-up again in 6 weeks and we will refocus to see how patient is responding.  I would also like to consider the possibility of x-rays of the thoracic and lumbar spine and questionable MRI of the thoracic if we continue to have difficulty but I think will be low likelihood.  We did discuss with patient if any worsening symptoms seem to occur with more activity may need to consider further work-up cardiac but also seems to be low likelihood at this time

## 2020-07-02 ENCOUNTER — Ambulatory Visit: Payer: BC Managed Care – PPO | Admitting: Family Medicine

## 2020-07-17 ENCOUNTER — Other Ambulatory Visit: Payer: Self-pay

## 2020-07-17 ENCOUNTER — Ambulatory Visit
Admission: RE | Admit: 2020-07-17 | Discharge: 2020-07-17 | Disposition: A | Discharge status: Home / Self Care | Payer: BC Managed Care – PPO | Source: Ambulatory Visit | Attending: Obstetrics & Gynecology | Admitting: Obstetrics & Gynecology

## 2020-07-17 DIAGNOSIS — Z1231 Encounter for screening mammogram for malignant neoplasm of breast: Secondary | ICD-10-CM

## 2020-08-10 NOTE — Progress Notes (Deleted)
[place] Phone: [phone] Subjective:    I'm seeing this patient by the request  of:  Caren Macadam, MD  CC:   [id number]   07/01/2020 Patient is having more of abdominal wall pain.  Seems to be in the mid epigastric region.  Differential is quite broad but patient has had work-up already including gastroenterology, neurology and over 1 year ago did have a cardiac work-up.  Patient on ultrasound does have an area that seems to potentially have a fascial injury in this area.  Mild increase in Doppler flow.  Mild increase in hypoechoic changes.  Can potentially go with the differential including a small sports hernia.  With Valsalva I did not see any true hernia present.  This is not perfectly correspond with patient's symptoms but we can try treating with patient already having significant work-up with no other findings at this time.  Patient does do a lot of heavy lifting with her husband being disabled.  This could have contributed to multiple small tears.  I do not see a very specific tear but could go with a very small fascial injury.  Patient given different choices for treatment options at this time.  We discussed that the patient was sent here for the possibility of a cutaneous nerve impingement and would consider the possibility injection but very rare for it to be midline.  At this point she declined any injection which I think is a good idea.  Patient also declined formal physical therapy at this point but would like to follow-up again in 6 weeks and we will refocus to see how patient is responding.  I would also like to consider the possibility of x-rays of the thoracic and lumbar spine and questionable MRI of the thoracic if we continue to have difficulty but I think will be low likelihood.  We did discuss with patient if any worsening symptoms seem to occur with more activity may need to consider further  work-up cardiac but also seems to be low likelihood at this time  Update 08/13/2020 Sabrina Gibbs is a 65 y.o. female coming in with complaint of abdominal wall pain.        Past Medical History:  Diagnosis Date  . Allergy to poison ivy    carried prednisone with her when she has possibility of exposure.   . Colon polyp, hyperplastic 2008  . Fibroid   . Pneumonia   . Thyroid disease    Past Surgical History:  Procedure Laterality Date  . BASAL CELL CARCINOMA EXCISION     nose; follows with dermatology  . KNEE ARTHROSCOPY Left 1999  . UPPER GASTROINTESTINAL ENDOSCOPY  03/26/2020  . VEIN SURGERY     Social History   Socioeconomic History  . Marital status: Married    Spouse name: Not on file  . Number of children: 0  . Years of education: Not on file  . Highest education level: Not on file  Occupational History  . Occupation: Retired Pharmacist, hospital  Tobacco Use  . Smoking status: Never Smoker  . Smokeless tobacco: Never Used  Vaping Use  . Vaping Use: Never used  Substance and Sexual Activity  . Alcohol use: Yes    Alcohol/week: 10.0 standard drinks    Types: 10 Glasses of wine per week    Comment: 3 per day  . Drug use: No  . Sexual activity: Yes    Partners: Male    Birth  control/protection: Post-menopausal  Other Topics Concern  . Not on file  Social History Narrative  . Not on file   Social Determinants of Health   Financial Resource Strain: Not on file  Food Insecurity: Not on file  Transportation Needs: Not on file  Physical Activity: Not on file  Stress: Not on file  Social Connections: Not on file   No Known Allergies Family History  Problem Relation Age of Onset  . High blood pressure Sister   . Other Brother        muscular neuropathy secondary to statin; gets infusions  . Diabetes Brother   . Heart disease Father   . Hypertension Father   . Varicose Veins Father   . Heart disease Mother   . Atrial fibrillation Mother   . Arrhythmia  Mother   . Neuropathy Other        diabetic related  . Varicose Veins Paternal Aunt   . Colon cancer Neg Hx   . Esophageal cancer Neg Hx   . Stomach cancer Neg Hx   . Rectal cancer Neg Hx     Current Outpatient Medications (Endocrine & Metabolic):  .  levothyroxine (SYNTHROID) 50 MCG tablet, Take 1 tablet (50 mcg total) by mouth daily.   Current Outpatient Medications (Respiratory):  .  fluticasone (FLONASE) 50 MCG/ACT nasal spray, Place 2 sprays into both nostrils daily.  Current Outpatient Medications (Analgesics):  .  ibuprofen (ADVIL) 600 MG tablet, Take 600 mg by mouth every 6 (six) hours as needed.   Current Outpatient Medications (Other):  .  cholecalciferol (VITAMIN D) 1000 units tablet, Take 1,000 Units by mouth daily. Marland Kitchen  esomeprazole (NEXIUM) 40 MG capsule, Take 1 capsule (40 mg total) by mouth 2 (two) times daily before a meal.   Reviewed prior external information including notes and imaging from  primary care provider As well as notes that were available from care everywhere and other healthcare systems.  Past medical history, social, surgical and family history all reviewed in electronic medical record.  No pertanent information unless stated regarding to the chief complaint.   Review of Systems:  No headache, visual changes, nausea, vomiting, diarrhea, constipation, dizziness, abdominal pain, skin rash, fevers, chills, night sweats, weight loss, swollen lymph nodes, body aches, joint swelling, chest pain, shortness of breath, mood changes. POSITIVE muscle aches  Objective  Last menstrual period 10/19/2010.   General: No apparent distress alert and oriented x3 mood and affect normal, dressed appropriately.  HEENT: Pupils equal, extraocular movements intact  Respiratory: Patient's speak in full sentences and does not appear short of breath  Cardiovascular: No lower extremity edema, non tender, no erythema  Gait normal with good balance and coordination.  MSK:   Non tender with full range of motion and good stability and symmetric strength and tone of shoulders, elbows, wrist, hip, knee and ankles bilaterally.     Impression and Recommendations:     The above documentation has been reviewed and is accurate and complete Sabrina Gibbs

## 2020-08-13 ENCOUNTER — Ambulatory Visit: Payer: BC Managed Care – PPO | Admitting: Family Medicine

## 2020-08-17 ENCOUNTER — Other Ambulatory Visit (HOSPITAL_BASED_OUTPATIENT_CLINIC_OR_DEPARTMENT_OTHER): Payer: Self-pay | Admitting: *Deleted

## 2020-08-18 MED ORDER — LEVOTHYROXINE SODIUM 50 MCG PO TABS: 50.0000 ug | ORAL_TABLET | Freq: Every day | ORAL | 1 refills | Status: DC

## 2020-08-21 ENCOUNTER — Ambulatory Visit: Payer: BC Managed Care – PPO

## 2020-09-07 ENCOUNTER — Ambulatory Visit: Payer: BC Managed Care – PPO | Admitting: Family Medicine

## 2020-10-27 ENCOUNTER — Ambulatory Visit (HOSPITAL_BASED_OUTPATIENT_CLINIC_OR_DEPARTMENT_OTHER): Payer: BC Managed Care – PPO | Admitting: Obstetrics & Gynecology

## 2021-01-07 ENCOUNTER — Ambulatory Visit (HOSPITAL_BASED_OUTPATIENT_CLINIC_OR_DEPARTMENT_OTHER): Payer: BC Managed Care – PPO | Admitting: Obstetrics & Gynecology

## 2021-02-02 ENCOUNTER — Telehealth (INDEPENDENT_AMBULATORY_CARE_PROVIDER_SITE_OTHER): Payer: Medicare PPO | Admitting: Adult Health

## 2021-02-02 ENCOUNTER — Encounter: Payer: Self-pay | Admitting: Adult Health

## 2021-02-02 VITALS — Temp 100.5°F | Ht 64.0 in | Wt 145.0 lb

## 2021-02-02 DIAGNOSIS — U071 COVID-19: Secondary | ICD-10-CM

## 2021-02-02 MED ORDER — MOLNUPIRAVIR EUA 200MG CAPSULE: 4.0000 | ORAL_CAPSULE | Freq: Two times a day (BID) | ORAL | 0 refills | Status: AC

## 2021-02-02 NOTE — Progress Notes (Signed)
Virtual Visit via Video Note  I connected with Gordy Savers on 02/02/21 at  5:00 PM EDT by a video enabled telemedicine application and verified that I am speaking with the correct person using two identifiers.  Location patient: home Location provider:work or home office Persons participating in the virtual visit: patient, provider  I discussed the limitations of evaluation and management by telemedicine and the availability of in person appointments. The patient expressed understanding and agreed to proceed.   HPI: 65 year old female who is being evaluated today for an acute issue.  She tested positive for COVID-19 today.  Her symptoms started less than 24 hours ago.  Her mother, who she is taking care of tested positive about 2 weeks ago.  Symptoms include fever, chills, headache, nasal congestion, fatigue, and generalized body aches.  Denies shortness of breath or wheezing.  She is interested in antiviral therapy  She is fully vaccinated   ROS: See pertinent positives and negatives per HPI.  Past Medical History:  Diagnosis Date   Allergy to poison ivy    carried prednisone with her when she has possibility of exposure.    Colon polyp, hyperplastic 2008   Fibroid    Pneumonia    Thyroid disease     Past Surgical History:  Procedure Laterality Date   BASAL CELL CARCINOMA EXCISION     nose; follows with dermatology   KNEE ARTHROSCOPY Left 1999   UPPER GASTROINTESTINAL ENDOSCOPY  03/26/2020   VEIN SURGERY      Family History  Problem Relation Age of Onset   High blood pressure Sister    Other Brother        muscular neuropathy secondary to statin; gets infusions   Diabetes Brother    Heart disease Father    Hypertension Father    Varicose Veins Father    Heart disease Mother    Atrial fibrillation Mother    Arrhythmia Mother    Neuropathy Other        diabetic related   Varicose Veins Paternal Aunt    Colon cancer Neg Hx    Esophageal cancer Neg Hx     Stomach cancer Neg Hx    Rectal cancer Neg Hx        Current Outpatient Medications:    cholecalciferol (VITAMIN D) 1000 units tablet, Take 1,000 Units by mouth daily., Disp: , Rfl:    fluticasone (FLONASE) 50 MCG/ACT nasal spray, Place 2 sprays into both nostrils daily., Disp: 16 g, Rfl: 6   ibuprofen (ADVIL) 600 MG tablet, Take 600 mg by mouth every 6 (six) hours as needed., Disp: , Rfl:    levothyroxine (SYNTHROID) 50 MCG tablet, Take 1 tablet (50 mcg total) by mouth daily., Disp: 90 tablet, Rfl: 1   molnupiravir EUA 200 mg CAPS, Take 4 capsules (800 mg total) by mouth 2 (two) times daily for 5 days., Disp: 40 capsule, Rfl: 0   esomeprazole (NEXIUM) 40 MG capsule, Take 1 capsule (40 mg total) by mouth 2 (two) times daily before a meal., Disp: 60 capsule, Rfl: 6  EXAM:  VITALS per patient if applicable:  GENERAL: alert, oriented, appears well and in no acute distress  HEENT: atraumatic, conjunttiva clear, no obvious abnormalities on inspection of external nose and ears  NECK: normal movements of the head and neck  LUNGS: on inspection no signs of respiratory distress, breathing rate appears normal, no obvious gross SOB, gasping or wheezing  CV: no obvious cyanosis  MS: moves all visible extremities without  noticeable abnormality  PSYCH/NEURO: pleasant and cooperative, no obvious depression or anxiety, speech and thought processing grossly intact  ASSESSMENT AND PLAN:  Discussed the following assessment and plan:  1. COVID-19  - molnupiravir EUA 200 mg CAPS; Take 4 capsules (800 mg total) by mouth 2 (two) times daily for 5 days.  Dispense: 40 capsule; Refill: 0       I discussed the assessment and treatment plan with the patient. The patient was provided an opportunity to ask questions and all were answered. The patient agreed with the plan and demonstrated an understanding of the instructions.   The patient was advised to call back or seek an in-person evaluation if  the symptoms worsen or if the condition fails to improve as anticipated.   Dorothyann Peng, NP

## 2021-02-06 ENCOUNTER — Other Ambulatory Visit (HOSPITAL_BASED_OUTPATIENT_CLINIC_OR_DEPARTMENT_OTHER): Payer: Self-pay | Admitting: Obstetrics & Gynecology

## 2021-03-01 ENCOUNTER — Other Ambulatory Visit: Payer: Self-pay | Admitting: Gastroenterology

## 2021-04-05 ENCOUNTER — Ambulatory Visit (HOSPITAL_BASED_OUTPATIENT_CLINIC_OR_DEPARTMENT_OTHER): Payer: BC Managed Care – PPO | Admitting: Obstetrics & Gynecology

## 2021-05-11 ENCOUNTER — Other Ambulatory Visit (HOSPITAL_COMMUNITY)
Admission: RE | Admit: 2021-05-11 | Discharge: 2021-05-11 | Disposition: A | Discharge status: Home / Self Care | Payer: Medicare PPO | Source: Ambulatory Visit | Attending: Obstetrics & Gynecology | Admitting: Obstetrics & Gynecology

## 2021-05-11 ENCOUNTER — Other Ambulatory Visit: Payer: Self-pay

## 2021-05-11 ENCOUNTER — Ambulatory Visit (INDEPENDENT_AMBULATORY_CARE_PROVIDER_SITE_OTHER): Payer: Medicare PPO | Admitting: Obstetrics & Gynecology

## 2021-05-11 ENCOUNTER — Encounter (HOSPITAL_BASED_OUTPATIENT_CLINIC_OR_DEPARTMENT_OTHER): Payer: Self-pay | Admitting: Obstetrics & Gynecology

## 2021-05-11 VITALS — BP 150/90 | HR 76 | Ht 64.0 in | Wt 151.2 lb

## 2021-05-11 DIAGNOSIS — Z01411 Encounter for gynecological examination (general) (routine) with abnormal findings: Secondary | ICD-10-CM | POA: Diagnosis not present

## 2021-05-11 DIAGNOSIS — E039 Hypothyroidism, unspecified: Secondary | ICD-10-CM | POA: Diagnosis not present

## 2021-05-11 DIAGNOSIS — D251 Intramural leiomyoma of uterus: Secondary | ICD-10-CM

## 2021-05-11 DIAGNOSIS — Z1211 Encounter for screening for malignant neoplasm of colon: Secondary | ICD-10-CM

## 2021-05-11 DIAGNOSIS — Z124 Encounter for screening for malignant neoplasm of cervix: Secondary | ICD-10-CM

## 2021-05-11 DIAGNOSIS — E2839 Other primary ovarian failure: Secondary | ICD-10-CM | POA: Diagnosis not present

## 2021-05-11 DIAGNOSIS — Z1231 Encounter for screening mammogram for malignant neoplasm of breast: Secondary | ICD-10-CM

## 2021-05-11 DIAGNOSIS — E041 Nontoxic single thyroid nodule: Secondary | ICD-10-CM | POA: Diagnosis not present

## 2021-05-11 MED ORDER — LEVOTHYROXINE SODIUM 50 MCG PO TABS
50.0000 ug | ORAL_TABLET | Freq: Every day | ORAL | 3 refills | Status: DC
Start: 2021-05-11 — End: 2022-05-09

## 2021-05-11 NOTE — Progress Notes (Signed)
65 y.o. [place] Mar[place] White or Caucasian female here for breast and pelvic exam.  I am also following her for history of .  Denies vaginal bleeding.  Blood pressure is elevated today.  Denies headache.    Patient's last menstrual period was 10/19/2010.          Sexually active: Yes.    H/O STD:  no  Health Maintenance: PCP:  Dr. Ethlyn Gallery.  Last wellness appt was 01/2020.  Did blood work at that appt:   Vaccines are up to date:  pt aware tdap is due and prevnar 20 is due Colonoscopy:  05/03/2012 MMG:  07/17/2020 Negative BMD:  07/05/2017, normal Last pap smear:  12/06/2016 Negative.   H/o abnormal pap smear:  no    reports that she has never smoked. She has never used smokeless tobacco. She reports current alcohol use of about 10.0 standard drinks per week. She reports that she does not use drugs.  Past Medical History:  Diagnosis Date   Allergy to poison ivy    carried prednisone with her when she has possibility of exposure.    Colon polyp, hyperplastic 2008   Fibroid    Pneumonia    Thyroid disease     Past Surgical History:  Procedure Laterality Date   BASAL CELL CARCINOMA EXCISION     nose; follows with dermatology   KNEE ARTHROSCOPY Left 1999   UPPER GASTROINTESTINAL ENDOSCOPY  03/26/2020   VEIN SURGERY      Current Outpatient Medications  Medication Sig Dispense Refill   cholecalciferol (VITAMIN D) 1000 units tablet Take 1,000 Units by mouth daily.     esomeprazole (NEXIUM) 40 MG capsule TAKE ONE CAPSULE BY MOUTH TWICE DAILY BEFORE MEALS 60 capsule 1   fluticasone (FLONASE) 50 MCG/ACT nasal spray Place 2 sprays into both nostrils daily. 16 g 6   ibuprofen (ADVIL) 600 MG tablet Take 600 mg by mouth every 6 (six) hours as needed.     levothyroxine (SYNTHROID) 50 MCG tablet Take 1 tablet (50 mcg total) by mouth daily. 90 tablet 0   No current facility-administered medications for this visit.    Family History  Problem Relation Age of Onset   High blood  pressure Sister    Other Brother        muscular neuropathy secondary to statin; gets infusions   Diabetes Brother    Heart disease Father    Hypertension Father    Varicose Veins Father    Heart disease Mother    Atrial fibrillation Mother    Arrhythmia Mother    Neuropathy Other        diabetic related   Varicose Veins Paternal Aunt    Colon cancer Neg Hx    Esophageal cancer Neg Hx    Stomach cancer Neg Hx    Rectal cancer Neg Hx     Review of Systems  All other systems reviewed and are negative.  Exam:   BP (!) 160/100 (BP Location: Left Arm, Patient Position: Sitting, Cuff Size: Normal)   Pulse 76   Ht 5\' 4"  (1.626 m)   Wt 151 lb 3.2 oz (68.6 kg)   LMP 10/19/2010   BMI 25.95 kg/m   Height: 5\' 4"  (162.6 cm)  General appearance: alert, cooperative and appears stated age Breasts: normal appearance, no masses or tenderness Abdomen: soft, non-tender; bowel sounds normal; no masses,  no organomegaly Lymph nodes: Cervical, supraclavicular, and axillary nodes normal.  No abnormal inguinal nodes palpated Neurologic: Grossly normal  Pelvic: External genitalia:  no lesions              Urethra:  normal appearing urethra with no masses, tenderness or lesions              Bartholins and Skenes: normal                 Vagina: normal appearing vagina with atrophic changes and no discharge, no lesions              Cervix: no lesions              Pap taken: Yes.   Bimanual Exam:  Uterus:   about 8 weeks with nodularity c/w fibroids, mobile              Adnexa: normal adnexa               Rectovaginal: Confirms               Anus:  normal sphincter tone, no lesions  Chaperone, Octaviano Batty, CMA, was present for exam.  Assessment/Plan: 1. Encntr for gyn exam (general) (routine) w abnormal findings - pap smear obtained today - MMG 06/2020 - BMD 2019 - colonoscopy 2013.  Was referred to Dr. Collene Mares in 2019 for colonoscopy.  Pt declined then.  Cologuard done in 2020 per Dr. Lorie Apley  recommendations.  Declines colonoscopy.  Cologuard ordered. - labs done with Dr. Ethlyn Gallery 01/2020 - care gaps updated/vaccinations reviewed  2. Hypoestrogenism - DG BONE DENSITY (DXA); Future  3. Acquired hypothyroidism - synthroid 91mcg daily.  #90/4RF - TSH - T4, free   4.  Thyroid nodule - ultrasound 2015 with 85mm nodule.  Follow up only recommended if risks for cancer.  5.  Uterine fibroids - exam stable today

## 2021-05-11 NOTE — Patient Instructions (Signed)
Prevnar 20

## 2021-05-12 ENCOUNTER — Ambulatory Visit (HOSPITAL_BASED_OUTPATIENT_CLINIC_OR_DEPARTMENT_OTHER): Payer: Medicare PPO | Admitting: Obstetrics & Gynecology

## 2021-05-12 DIAGNOSIS — E041 Nontoxic single thyroid nodule: Secondary | ICD-10-CM | POA: Insufficient documentation

## 2021-05-12 DIAGNOSIS — D251 Intramural leiomyoma of uterus: Secondary | ICD-10-CM | POA: Insufficient documentation

## 2021-05-12 LAB — TSH: TSH: 2.13 u[IU]/mL (ref 0.450–4.500)

## 2021-05-12 LAB — T4, FREE: Free T4: 1.25 ng/dL (ref 0.82–1.77)

## 2021-05-17 LAB — CYTOLOGY - PAP: Diagnosis: NEGATIVE

## 2021-06-16 DIAGNOSIS — Z85828 Personal history of other malignant neoplasm of skin: Secondary | ICD-10-CM | POA: Diagnosis not present

## 2021-06-16 DIAGNOSIS — L239 Allergic contact dermatitis, unspecified cause: Secondary | ICD-10-CM | POA: Diagnosis not present

## 2021-06-23 DIAGNOSIS — Z1211 Encounter for screening for malignant neoplasm of colon: Secondary | ICD-10-CM | POA: Diagnosis not present

## 2021-06-30 LAB — COLOGUARD: COLOGUARD: NEGATIVE

## 2021-07-02 ENCOUNTER — Other Ambulatory Visit: Payer: Self-pay | Admitting: Obstetrics & Gynecology

## 2021-07-02 DIAGNOSIS — E2839 Other primary ovarian failure: Secondary | ICD-10-CM

## 2021-07-02 DIAGNOSIS — Z1231 Encounter for screening mammogram for malignant neoplasm of breast: Secondary | ICD-10-CM

## 2021-07-16 ENCOUNTER — Other Ambulatory Visit: Payer: Self-pay | Admitting: Gastroenterology

## 2021-07-22 ENCOUNTER — Ambulatory Visit (HOSPITAL_BASED_OUTPATIENT_CLINIC_OR_DEPARTMENT_OTHER): Payer: Medicare PPO | Admitting: Radiology

## 2021-07-22 ENCOUNTER — Ambulatory Visit (HOSPITAL_BASED_OUTPATIENT_CLINIC_OR_DEPARTMENT_OTHER)
Admission: RE | Admit: 2021-07-22 | Discharge: 2021-07-22 | Disposition: A | Discharge status: Home / Self Care | Payer: Medicare PPO | Source: Ambulatory Visit | Attending: Obstetrics & Gynecology | Admitting: Obstetrics & Gynecology

## 2021-07-22 ENCOUNTER — Ambulatory Visit (HOSPITAL_BASED_OUTPATIENT_CLINIC_OR_DEPARTMENT_OTHER): Payer: Medicare PPO

## 2021-07-22 ENCOUNTER — Other Ambulatory Visit: Payer: Self-pay

## 2021-07-22 ENCOUNTER — Ambulatory Visit
Admission: RE | Admit: 2021-07-22 | Discharge: 2021-07-22 | Disposition: A | Discharge status: Home / Self Care | Payer: Medicare PPO | Source: Ambulatory Visit | Attending: Obstetrics & Gynecology | Admitting: Obstetrics & Gynecology

## 2021-07-22 DIAGNOSIS — Z1231 Encounter for screening mammogram for malignant neoplasm of breast: Secondary | ICD-10-CM

## 2021-07-22 DIAGNOSIS — Z780 Asymptomatic menopausal state: Secondary | ICD-10-CM | POA: Diagnosis not present

## 2021-07-22 DIAGNOSIS — E2839 Other primary ovarian failure: Secondary | ICD-10-CM | POA: Diagnosis not present

## 2021-07-27 DIAGNOSIS — H0102B Squamous blepharitis left eye, upper and lower eyelids: Secondary | ICD-10-CM | POA: Diagnosis not present

## 2021-07-27 DIAGNOSIS — H0102A Squamous blepharitis right eye, upper and lower eyelids: Secondary | ICD-10-CM | POA: Diagnosis not present

## 2021-07-27 DIAGNOSIS — H1045 Other chronic allergic conjunctivitis: Secondary | ICD-10-CM | POA: Diagnosis not present

## 2021-07-27 DIAGNOSIS — L719 Rosacea, unspecified: Secondary | ICD-10-CM | POA: Diagnosis not present

## 2021-07-27 DIAGNOSIS — H43813 Vitreous degeneration, bilateral: Secondary | ICD-10-CM | POA: Diagnosis not present

## 2021-07-27 DIAGNOSIS — H2513 Age-related nuclear cataract, bilateral: Secondary | ICD-10-CM | POA: Diagnosis not present

## 2021-08-15 ENCOUNTER — Other Ambulatory Visit: Payer: Self-pay | Admitting: Gastroenterology

## 2021-10-11 ENCOUNTER — Ambulatory Visit (INDEPENDENT_AMBULATORY_CARE_PROVIDER_SITE_OTHER): Payer: Medicare PPO

## 2021-10-11 VITALS — Ht 64.0 in | Wt 151.0 lb

## 2021-10-11 DIAGNOSIS — Z0000 Encounter for general adult medical examination without abnormal findings: Secondary | ICD-10-CM | POA: Diagnosis not present

## 2021-10-11 NOTE — Patient Instructions (Addendum)
?[name] , ?Thank you for taking time to come for your Medicare Wellness Visit (age 66). I appreciate your ongoing commitment to your health goals. Please review the following plan we discussed and let me know if I can assist you in the future.  ? ?These are the goals we discussed: ? Goals   ? ?   Weight (lb) < 200 lb (90.7 kg) (pt-stated)   ?   I could lose a few pounds ?  ? ?  ?  ?This is a list of the screening recommended for you and due dates:  ?Health Maintenance  ?Topic Date Due  ? Tetanus Vaccine  02/27/2021  ? COVID-19 Vaccine (3 - Booster for Pfizer series) 10/27/2021*  ? Pneumonia Vaccine (2 - PCV) 10/12/2022*  ? Flu Shot  01/18/2022  ? Colon Cancer Screening  05/03/2022  ? Mammogram  07/23/2023  ? DEXA scan (bone density measurement)  Completed  ? Hepatitis C Screening: USPSTF Recommendation to screen - Ages 66-79 yo.  Completed  ? Zoster (Shingles) Vaccine  Completed  ? HPV Vaccine  Aged Out  ?*Topic was postponed. The date shown is not the original due date.  ?  ?Advanced directives: Yes Patient will bring copy ? ?Conditions/risks identified: None ? ?Next appointment: Follow up in one year for your annual wellness visit  ? ? ?Preventive Care 66 Years and Older, Female ?Preventive care refers to lifestyle choices and visits with your health care provider that can promote health and wellness. ?What does preventive care include? ?A yearly physical exam. This is also called an annual well check. ?Dental exams once or twice a year. ?Routine eye exams. Ask your health care provider how often you should have your eyes checked. ?Personal lifestyle choices, including: ?Daily care of your teeth and gums. ?Regular physical activity. ?Eating a healthy diet. ?Avoiding tobacco and drug use. ?Limiting alcohol use. ?Practicing safe sex. ?Taking low-dose aspirin every day. ?Taking vitamin and mineral supplements as recommended by your health care provider. ?What happens during an annual well check? ?The services and  screenings done by your health care provider during your annual well check will depend on your age, overall health, lifestyle risk factors, and family history of disease. ?Counseling  ?Your health care provider may ask you questions about your: ?Alcohol use. ?Tobacco use. ?Drug use. ?Emotional well-being. ?Home and relationship well-being. ?Sexual activity. ?Eating habits. ?History of falls. ?Memory and ability to understand (cognition). ?Work and work Statistician. ?Reproductive health. ?Screening  ?You may have the following tests or measurements: ?Height, weight, and BMI. ?Blood pressure. ?Lipid and cholesterol levels. These may be checked every 5 years, or more frequently if you are over 65 years old. ?Skin check. ?Lung cancer screening. You may have this screening every year starting at age 66 if you have a 30-pack-year history of smoking and currently smoke or have quit within the past 15 years. ?Fecal occult blood test (FOBT) of the stool. You may have this test every year starting at age 66. ?Flexible sigmoidoscopy or colonoscopy. You may have a sigmoidoscopy every 5 years or a colonoscopy every 10 years starting at age 66. ?Hepatitis C blood test. ?Hepatitis B blood test. ?Sexually transmitted disease (STD) testing. ?Diabetes screening. This is done by checking your blood sugar (glucose) after you have not eaten for a while (fasting). You may have this done every 1-3 years. ?Bone density scan. This is done to screen for osteoporosis. You may have this done starting at age 66. ?Mammogram. This may be done  every 1-2 years. Talk to your health care provider about how often you should have regular mammograms. ?Talk with your health care provider about your test results, treatment options, and if necessary, the need for more tests. ?Vaccines  ?Your health care provider may recommend certain vaccines, such as: ?Influenza vaccine. This is recommended every year. ?Tetanus, diphtheria, and acellular pertussis (Tdap,  Td) vaccine. You may need a Td booster every 10 years. ?Zoster vaccine. You may need this after age 57. ?Pneumococcal 13-valent conjugate (PCV13) vaccine. One dose is recommended after age 66. ?Pneumococcal polysaccharide (PPSV23) vaccine. One dose is recommended after age 66. ?Talk to your health care provider about which screenings and vaccines you need and how often you need them. ?This information is not intended to replace advice given to you by your health care provider. Make sure you discuss any questions you have with your health care provider. ?Document Released: 07/03/2015 Document Revised: 02/24/2016 Document Reviewed: 04/07/2015 ?Elsevier Interactive Patient Education ? 2017 [place]. ? ?Fall Prevention in the Home ?Falls can cause injuries. They can happen to people of all ages. There are many things you can do to make your home safe and to help prevent falls. ?What can I do on the outside of my home? ?Regularly fix the edges of walkways and driveways and fix any cracks. ?Remove anything that might make you trip as you walk through a door, such as a raised step or threshold. ?Trim any bushes or trees on the path to your home. ?Use bright outdoor lighting. ?Clear any walking paths of anything that might make someone trip, such as rocks or tools. ?Regularly check to see if handrails are loose or broken. Make sure that both sides of any steps have handrails. ?Any raised decks and porches should have guardrails on the edges. ?Have any leaves, snow, or ice cleared regularly. ?Use sand or salt on walking paths during winter. ?Clean up any spills in your garage right away. This includes oil or grease spills. ?What can I do in the bathroom? ?Use night lights. ?Install grab bars by the toilet and in the tub and shower. Do not use towel bars as grab bars. ?Use non-skid mats or decals in the tub or shower. ?If you need to sit down in the shower, use a plastic, non-slip stool. ?Keep the floor dry. Clean up any  water that spills on the floor as soon as it happens. ?Remove soap buildup in the tub or shower regularly. ?Attach bath mats securely with double-sided non-slip rug tape. ?Do not have throw rugs and other things on the floor that can make you trip. ?What can I do in the bedroom? ?Use night lights. ?Make sure that you have a light by your bed that is easy to reach. ?Do not use any sheets or blankets that are too big for your bed. They should not hang down onto the floor. ?Have a firm chair that has side arms. You can use this for support while you get dressed. ?Do not have throw rugs and other things on the floor that can make you trip. ?What can I do in the kitchen? ?Clean up any spills right away. ?Avoid walking on wet floors. ?Keep items that you use a lot in easy-to-reach places. ?If you need to reach something above you, use a strong step stool that has a grab bar. ?Keep electrical cords out of the way. ?Do not use floor polish or wax that makes floors slippery. If you must use wax, use  non-skid floor wax. ?Do not have throw rugs and other things on the floor that can make you trip. ?What can I do with my stairs? ?Do not leave any items on the stairs. ?Make sure that there are handrails on both sides of the stairs and use them. Fix handrails that are broken or loose. Make sure that handrails are as long as the stairways. ?Check any carpeting to make sure that it is firmly attached to the stairs. Fix any carpet that is loose or worn. ?Avoid having throw rugs at the top or bottom of the stairs. If you do have throw rugs, attach them to the floor with carpet tape. ?Make sure that you have a light switch at the top of the stairs and the bottom of the stairs. If you do not have them, ask someone to add them for you. ?What else can I do to help prevent falls? ?Wear shoes that: ?Do not have high heels. ?Have rubber bottoms. ?Are comfortable and fit you well. ?Are closed at the toe. Do not wear sandals. ?If you use a  stepladder: ?Make sure that it is fully opened. Do not climb a closed stepladder. ?Make sure that both sides of the stepladder are locked into place. ?Ask someone to hold it for you, if possible. ?Clearly mark

## 2021-10-11 NOTE — Progress Notes (Signed)
? ?Subjective:  ? Sabrina Gibbs is a 66 y.o. female who presents for Medicare Annual (Subsequent) preventive examination. ? ?Review of Systems    ?Virtual Visit via Telephone Note ? ?I connected with  Sabrina Gibbs on 10/11/21 at  1:15 PM EDT by telephone and verified that I am speaking with the correct person using two identifiers. ? ?Location: ?Patient: Home ?Provider: Office ?Persons participating in the virtual visit: patient/Nurse Health Advisor ?  ?I discussed the limitations, risks, security and privacy concerns of performing an evaluation and management service by telephone and the availability of in person appointments. The patient expressed understanding and agreed to proceed. ? ?Interactive audio and video telecommunications were attempted between this nurse and patient, however failed, due to patient having technical difficulties OR patient did not have access to video capability.  We continued and completed visit with audio only. ? ?Some vital signs may be absent or patient reported.  ? ?Criselda Peaches, LPN  ?Cardiac Risk Factors include: advanced age (>6mn, >>16women) ? ?   ?Objective:  ?  ?Today's Vitals  ? 10/11/21 1314  ?Weight: 151 lb (68.5 kg)  ?Height: '5\' 4"'$  (1.626 m)  ? ?Body mass index is 25.92 kg/m?. ? ? ?  10/11/2021  ?  1:21 PM 10/19/2018  ? 10:30 AM  ?Advanced Directives  ?Does Patient Have a Medical Advance Directive? Yes Yes  ?Type of [place]amed[place]of [place] will   ?Does patient want to make changes to medical advance directive? No - Patient declined Yes (MAU/Ambulatory/Procedural Areas - Information given)  ?Copy of [place] Chart? No - copy requested   ? ? ?Current Medications (verified) ?Outpatient Encounter Medications as of 10/11/2021  ?Medication Sig  ? cholecalciferol (VITAMIN D) 1000 units tablet Take 1,000 Units by mouth daily.  ? esomeprazole (NEXIUM) 40 MG capsule TAKE 1 CAPSULE BY MOUTH TWICE DAILY BEFORE MEALS  ?  fluticasone (FLONASE) 50 MCG/ACT nasal spray Place 2 sprays into both nostrils daily.  ? ibuprofen (ADVIL) 600 MG tablet Take 600 mg by mouth every 6 (six) hours as needed.  ? levothyroxine (SYNTHROID) 50 MCG tablet Take 1 tablet (50 mcg total) by mouth daily.  ? ?No facility-administered encounter medications on file as of 10/11/2021.  ? ? ?Allergies (verified) ?Patient has no known allergies.  ? ?History: ?Past Medical History:  ?Diagnosis Date  ? Allergy to poison ivy   ? carried prednisone with her when she has possibility of exposure.   ? Colon polyp, hyperplastic 2008  ? Fibroid   ? Pneumonia   ? Thyroid disease   ? ?Past Surgical History:  ?Procedure Laterality Date  ? BASAL CELL CARCINOMA EXCISION    ? nose; follows with dermatology  ? KNEE ARTHROSCOPY Left 1999  ? UPPER GASTROINTESTINAL ENDOSCOPY  03/26/2020  ? VEIN SURGERY    ? ?Family History  ?Problem Relation Age of Onset  ? High blood pressure Sister   ? Other Brother   ?     muscular neuropathy secondary to statin; gets infusions  ? Diabetes Brother   ? Heart disease Father   ? Hypertension Father   ? Varicose Veins Father   ? Heart disease Mother   ? Atrial fibrillation Mother   ? Arrhythmia Mother   ? Neuropathy Other   ?     diabetic related  ? Varicose Veins Paternal Aunt   ? Colon cancer Neg Hx   ? Esophageal cancer Neg Hx   ? Stomach  cancer Neg Hx   ? Rectal cancer Neg Hx   ? ?Social History  ? ?Socioeconomic History  ? Marital status: Married  ?  Spouse name: Not on file  ? Number of children: 0  ? Years of education: Not on file  ? Highest education level: Not on file  ?Occupational History  ? Occupation: Retired Pharmacist, hospital  ?Tobacco Use  ? Smoking status: Never  ? Smokeless tobacco: Never  ?Vaping Use  ? Vaping Use: Never used  ?Substance and Sexual Activity  ? Alcohol use: Yes  ?  Alcohol/week: 10.0 standard drinks  ?  Types: 10 Glasses of wine per week  ?  Comment: 3 per day  ? Drug use: No  ? Sexual activity: Yes  ?  Partners: Male  ?  Birth  control/protection: Post-menopausal  ?Other Topics Concern  ? Not on file  ?Social History Narrative  ? Not on file  ? ?Social Determinants of Health  ? ?Financial Resource Strain: Low Risk   ? Difficulty of Paying Living Expenses: Not hard at all  ?Food Insecurity: No Food Insecurity  ? Worried About Charity fundraiser in the Last Year: Never true  ? Ran Out of Food in the Last Year: Never true  ?Transportation Needs: No Transportation Needs  ? Lack of Transportation (Medical): No  ? Lack of Transportation (Non-Medical): No  ?Physical Activity: Sufficiently Active  ? Days of Exercise per Week: 5 days  ? Minutes of Exercise per Session: 60 min  ?Stress: No Stress Concern Present  ? Feeling of Stress : Not at all  ?Social Connections: Moderately Isolated  ? Frequency of Communication with Friends and Family: More than three times a week  ? Frequency of Social Gatherings with Friends and Family: More than three times a week  ? Attends Religious Services: Never  ? Active Member of Clubs or Organizations: No  ? Attends Archivist Meetings: Never  ? Marital Status: Married  ? ? ? ?Clinical Intake: ? ?Pre-visit preparation completed: No ? ?Diabetic?  No ? ? ?Activities of Daily Living ? ?  10/11/2021  ?  1:20 PM  ?In your present state of health, do you have any difficulty performing the following activities:  ?Hearing? 0  ?Vision? 0  ?Difficulty concentrating or making decisions? 0  ?Walking or climbing stairs? 0  ?Dressing or bathing? 0  ?Doing errands, shopping? 0  ?Preparing Food and eating ? N  ?Using the Toilet? N  ?In the past six months, have you accidently leaked urine? N  ?Do you have problems with loss of bowel control? N  ?Managing your Medications? N  ?Managing your Finances? N  ?Housekeeping or managing your Housekeeping? N  ? ? ?Patient Care Team: ?Caren Macadam, MD as PCP - General (Family Medicine) ?Rolm Bookbinder, MD as Consulting Physician (Dermatology) ?Juanita Craver, MD as Consulting  Physician (Gastroenterology) ?Megan Salon, MD as Consulting Physician (Gynecology) ?Alda Berthold, DO as Consulting Physician (Neurology) ? ?Indicate any recent Medical Services you may have received from other than [facility] providers in the past year (date may be approximate). ? ?   ?Assessment:  ? This is a routine wellness examination for [place]. ? ?Hearing/Vision screen ?Hearing Screening - Comments:: No hearing difficulty ?Vision Screening - Comments:: Wears glasses. Followed by Dr Katy Fitch ? ?Dietary issues and exercise activities discussed: ?Exercise limited by: None identified ? ? Goals Addressed   ? ?  ?  ?  ?  ?  ? This  Visit's Progress  ?   Weight (lb) < 200 lb (90.7 kg) (pt-stated)   151 lb (68.5 kg)  ?   I could lose a few pounds ?  ? ?  ? ?Depression Screen ? ?  10/11/2021  ?  1:18 PM 05/11/2021  ?  2:05 PM 01/27/2020  ? 12:10 PM  ?PHQ 2/9 Scores  ?PHQ - 2 Score 0 0 0  ?  ?Fall Risk ? ?  10/11/2021  ?  1:21 PM 10/19/2018  ? 10:30 AM  ?Fall Risk   ?Falls in the past year? 0 0  ?Number falls in past yr: 0 0  ?Injury with Fall? 0 0  ?Risk for fall due to : No Fall Risks   ?Follow up  Falls evaluation completed  ? ? ?FALL RISK PREVENTION PERTAINING TO THE HOME: ? ?Any stairs in or around the home? Yes  ?If so, are there any without handrails? No  ?Home free of loose throw rugs in walkways, pet beds, electrical cords, etc? Yes  ?Adequate lighting in your home to reduce risk of falls? Yes  ? ?ASSISTIVE DEVICES UTILIZED TO PREVENT FALLS: ? ?Life alert? No  ?Use of a cane, walker or w/c? No  ?Grab bars in the bathroom? Yes  ?Shower chair or bench in shower? No  ?Elevated toilet seat or a handicapped toilet? Yes  ? ?TIMED UP AND GO: ? ?Was the test performed? No . Audio Visit ? ?Cognitive Function: ? ?  10/11/2021  ?  1:22 PM  ?6CIT Screen  ?What Year? 0 points  ?What month? 0 points  ?What time? 0 points  ?Count back from 20 0 points  ?Months in reverse 0 points  ?Repeat phrase 0 points  ?Total Score 0 points   ? ? ?Immunizations ?Immunization History  ?Administered Date(s) Administered  ? Fluad Quad(high Dose 65+) 04/17/2019  ? Influenza Split 04/15/2011  ? Influenza Whole 06/25/2009  ? Influenza, Seasonal, Injecte, Pre

## 2021-10-14 DIAGNOSIS — D2261 Melanocytic nevi of right upper limb, including shoulder: Secondary | ICD-10-CM | POA: Diagnosis not present

## 2021-10-14 DIAGNOSIS — L820 Inflamed seborrheic keratosis: Secondary | ICD-10-CM | POA: Diagnosis not present

## 2021-10-14 DIAGNOSIS — L570 Actinic keratosis: Secondary | ICD-10-CM | POA: Diagnosis not present

## 2021-10-14 DIAGNOSIS — L239 Allergic contact dermatitis, unspecified cause: Secondary | ICD-10-CM | POA: Diagnosis not present

## 2021-10-14 DIAGNOSIS — D0439 Carcinoma in situ of skin of other parts of face: Secondary | ICD-10-CM | POA: Diagnosis not present

## 2021-10-14 DIAGNOSIS — Z85828 Personal history of other malignant neoplasm of skin: Secondary | ICD-10-CM | POA: Diagnosis not present

## 2021-10-14 DIAGNOSIS — L400 Psoriasis vulgaris: Secondary | ICD-10-CM | POA: Diagnosis not present

## 2021-10-15 ENCOUNTER — Encounter (HOSPITAL_COMMUNITY): Payer: Self-pay

## 2021-10-15 ENCOUNTER — Emergency Department (HOSPITAL_COMMUNITY): Payer: Medicare PPO

## 2021-10-15 ENCOUNTER — Other Ambulatory Visit: Payer: Self-pay

## 2021-10-15 ENCOUNTER — Emergency Department (HOSPITAL_COMMUNITY)
Admission: EM | Admit: 2021-10-15 | Discharge: 2021-10-15 | Disposition: A | Discharge status: Home / Self Care | Payer: Medicare PPO | Attending: Student | Admitting: Student

## 2021-10-15 DIAGNOSIS — R0789 Other chest pain: Secondary | ICD-10-CM | POA: Diagnosis not present

## 2021-10-15 DIAGNOSIS — Y92481 Parking lot as the place of occurrence of the external cause: Secondary | ICD-10-CM | POA: Diagnosis not present

## 2021-10-15 DIAGNOSIS — R110 Nausea: Secondary | ICD-10-CM | POA: Diagnosis not present

## 2021-10-15 DIAGNOSIS — S299XXA Unspecified injury of thorax, initial encounter: Secondary | ICD-10-CM | POA: Diagnosis not present

## 2021-10-15 DIAGNOSIS — D72819 Decreased white blood cell count, unspecified: Secondary | ICD-10-CM | POA: Insufficient documentation

## 2021-10-15 DIAGNOSIS — E039 Hypothyroidism, unspecified: Secondary | ICD-10-CM | POA: Diagnosis not present

## 2021-10-15 DIAGNOSIS — Z79899 Other long term (current) drug therapy: Secondary | ICD-10-CM | POA: Diagnosis not present

## 2021-10-15 DIAGNOSIS — E876 Hypokalemia: Secondary | ICD-10-CM | POA: Insufficient documentation

## 2021-10-15 DIAGNOSIS — I959 Hypotension, unspecified: Secondary | ICD-10-CM | POA: Diagnosis not present

## 2021-10-15 DIAGNOSIS — R55 Syncope and collapse: Secondary | ICD-10-CM | POA: Insufficient documentation

## 2021-10-15 DIAGNOSIS — R231 Pallor: Secondary | ICD-10-CM | POA: Diagnosis not present

## 2021-10-15 LAB — COMPREHENSIVE METABOLIC PANEL
ALT: 28 U/L (ref 0–44)
AST: 42 U/L — ABNORMAL HIGH (ref 15–41)
Albumin: 3.8 g/dL (ref 3.5–5.0)
Alkaline Phosphatase: 63 U/L (ref 38–126)
Anion gap: 10 (ref 5–15)
BUN: 9 mg/dL (ref 8–23)
CO2: 23 mmol/L (ref 22–32)
Calcium: 8.7 mg/dL — ABNORMAL LOW (ref 8.9–10.3)
Chloride: 107 mmol/L (ref 98–111)
Creatinine, Ser: 0.66 mg/dL (ref 0.44–1.00)
GFR, Estimated: >60 mL/min (ref >60)
Glucose, Bld: 105 mg/dL — ABNORMAL HIGH (ref 70–99)
Potassium: 3.3 mmol/L — ABNORMAL LOW (ref 3.5–5.1)
Sodium: 140 mmol/L (ref 135–145)
Total Bilirubin: 0.9 mg/dL (ref 0.3–1.2)
Total Protein: 6.1 g/dL — ABNORMAL LOW (ref 6.5–8.1)

## 2021-10-15 LAB — TROPONIN I (HIGH SENSITIVITY)
Troponin I (High Sensitivity): 3 ng/L (ref <18)
Troponin I (High Sensitivity): 4 ng/L (ref <18)

## 2021-10-15 LAB — URINALYSIS, ROUTINE W REFLEX MICROSCOPIC
Bilirubin Urine: NEGATIVE
Glucose, UA: NEGATIVE mg/dL
Ketones, ur: 5 mg/dL — AB
Leukocytes,Ua: NEGATIVE
Nitrite: POSITIVE — AB
Protein, ur: NEGATIVE mg/dL
Specific Gravity, Urine: 1.018 (ref 1.005–1.030)
pH: 5 (ref 5.0–8.0)

## 2021-10-15 LAB — CBC
HCT: 41.3 % (ref 36.0–46.0)
Hemoglobin: 14 g/dL (ref 12.0–15.0)
MCH: 32 pg (ref 26.0–34.0)
MCHC: 33.9 g/dL (ref 30.0–36.0)
MCV: 94.5 fL (ref 80.0–100.0)
Platelets: 189 10*3/uL (ref 150–400)
RBC: 4.37 MIL/uL (ref 3.87–5.11)
RDW: 12.2 % (ref 11.5–15.5)
WBC: 3.9 10*3/uL — ABNORMAL LOW (ref 4.0–10.5)
nRBC: 0 % (ref 0.0–0.2)

## 2021-10-15 LAB — D-DIMER, QUANTITATIVE: D-Dimer, Quant: 1.12 ug/mL-FEU — ABNORMAL HIGH (ref 0.00–0.50)

## 2021-10-15 LAB — CBG MONITORING, ED: Glucose-Capillary: 93 mg/dL (ref 70–99)

## 2021-10-15 LAB — MAGNESIUM: Magnesium: 1.8 mg/dL (ref 1.7–2.4)

## 2021-10-15 LAB — TSH: TSH: 3.339 u[IU]/mL (ref 0.350–4.500)

## 2021-10-15 MED ORDER — LACTATED RINGERS IV BOLUS
1000.0000 mL | Freq: Once | INTRAVENOUS | Status: AC
Start: 2021-10-15 — End: 2021-10-15
  Administered 2021-10-15: 1000 mL via INTRAVENOUS

## 2021-10-15 MED ORDER — KETOROLAC TROMETHAMINE 15 MG/ML IJ SOLN
15.0000 mg | Freq: Once | INTRAMUSCULAR | Status: AC
  Administered 2021-10-15: 15 mg via INTRAVENOUS
  Filled 2021-10-15: qty 1

## 2021-10-15 MED ORDER — IOHEXOL 350 MG/ML SOLN
50.0000 mL | Freq: Once | INTRAVENOUS | Status: AC | PRN
  Administered 2021-10-15: 50 mL via INTRAVENOUS
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
## 2021-10-20 ENCOUNTER — Ambulatory Visit: Payer: Medicare PPO | Admitting: Cardiovascular Disease

## 2021-10-20 ENCOUNTER — Encounter: Payer: Self-pay | Admitting: Cardiovascular Disease

## 2021-10-20 VITALS — BP 160/86 | HR 82 | Ht 64.0 in | Wt 155.6 lb

## 2021-10-20 DIAGNOSIS — R55 Syncope and collapse: Secondary | ICD-10-CM | POA: Diagnosis not present

## 2021-10-20 DIAGNOSIS — E039 Hypothyroidism, unspecified: Secondary | ICD-10-CM | POA: Diagnosis not present

## 2021-10-20 DIAGNOSIS — R9431 Abnormal electrocardiogram [ECG] [EKG]: Secondary | ICD-10-CM

## 2021-10-20 NOTE — Patient Instructions (Signed)
Medication Instructions:  ?No changes ?*If you need a refill on your cardiac medications before your next appointment, please call your pharmacy* ? ? ?Lab Work: ?None ordered ?If you have labs (blood work) drawn today and your tests are completely normal, you will receive your results only by: ?MyChart Message (if you have MyChart) OR ?A paper copy in the mail ?If you have any lab test that is abnormal or we need to change your treatment, we will call you to review the results. ? ? ?Testing/Procedures: ?Your physician has requested that you have an echocardiogram. Echocardiography is a painless test that uses sound waves to create images of your heart. It provides your doctor with information about the size and shape of your heart and how well your heart?s chambers and valves are working. You may receive an ultrasound enhancing agent through an IV if needed to better visualize your heart during the echo.This procedure takes approximately one hour. There are no restrictions for this procedure. This will take place at the [address], [address].  ? ? ?Follow-Up: ?At [facility], you and your health needs are our priority.  As part of our continuing mission to provide you with exceptional heart care, we have created designated Provider Care Teams.  These Care Teams include your primary Cardiologist (physician) and Advanced Practice Providers (APPs -  Physician Assistants and Nurse Practitioners) who all work together to provide you with the care you need, when you need it. ? ?We recommend signing up for the patient portal called "MyChart".  Sign up information is provided on this After Visit Summary.  MyChart is used to connect with patients for Virtual Visits (Telemedicine).  Patients are able to view lab/test results, encounter notes, upcoming appointments, etc.  Non-urgent messages can be sent to your provider as well.   ?To learn more about what you can do with MyChart, go to [email or web address].    ? ?Your next appointment:   ?12 month(s) ? ?The format for your next appointment:   ?In Person ? ?Provider:   ?Sanda Klein, MD { ? ? ?Other Instructions ? ? ?[facility] at [facility]  ?[address], [address]  ?[place], [place] [address]  ?Phone: [phone] Fax: [phone] ?  ?You are scheduled for a Loop Recorder Implant on 11/01/21 at 12:15 with Dr. Sallyanne Kuster. This will take place at [address] [place], [address].  ?  ?Please arrive at your appointment 15-20 minutes early. ?  ?You do not need to be fasting.  ? ?The procedure is performed with local anesthesia. You will not receive sedatives nor will an IV be placed.  ? ?Wash your chest and neck with the surgical soap the evening before and the morning of your procedure. Please following the washing instructions provided.  ? ?As with all surgical implants, there is a small risk of infection. If an infection occurs, the device will be removed. To help reduce the risk, please use the surgical scrub provided. Additional antiseptic precautions will be taken at the time of the procedure.  ? ?Please bring your insurance cards.  ? ?You will be scheduled for a two week wound check visit with Dr. Sallyanne Kuster. This will be a video visit. If you are unable to do a video visit then you may come into the office. ? ?*Please note that scheduled loop recorder implants may need to be rescheduled if Dr. Sallyanne Kuster has a procedure urgently added on at the hospital ? ? ?Preparing for the Procedure ? ?  Before the procedure, you can play an important role. Because skin is not sterile, your skin needs to be as free of germs as possible. You can reduce the number of germs on your skin by washing with CHG (chlorhexidine gluconate) Soap before the procedure. CHG is an antiseptic cleaner which kills germs and bonds with the skin to continue killing germs even after washing. ? ?Please do not use if you have an allergy to CHG or antibacterial soaps. If your skin becomes  reddened/irritated, STOP using the CHG. ? ?DO NOT SHAVE (including legs and underarms) for at least 48 hours prior to first CHG shower. It is OK to shave your face. ? ?Please follow these instructions carefully: ?Shower the night before the procedure and the morning of with CHG Soap. ?If you chose to wash your hair, wash your hair first as usual with your normal shampoo/conditioner. ?After you shampoo/condition, rinse you hair and body thoroughly to remove shampoo/conditioner. ?Use CHG as you would any other liquid soap. You can apply CHG directly to the skin and wash gently with a loofah or a clean washcloth. ?Apply the CHG Soap to your body ONLY FROM THE NECK DOWN. Do not use on open wounds or open sores. Avoid contact with your eyes, ears, mouth, and genitals (private parts).  ?Wash thoroughly, paying special attention to the area where your surgery will be performed. ?Thoroughly rinse your body with warm water from the neck down. ?DO NOT shower/wash with your normal soap after using and rinsing off the CHG Soap. ?Pat yourself dry with a clean towel. ?Wear clean pajamas to bed. ?Place clean sheets on your bed the night of your first shower and do not sleep with pets.. ? ?Day of Surgery: ?Shower with the CHG Soap following the instructions listed above. DO NOT apply deodorants or lotions.  ? ? ? ?
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
## 2021-10-27 ENCOUNTER — Ambulatory Visit (INDEPENDENT_AMBULATORY_CARE_PROVIDER_SITE_OTHER): Payer: Medicare PPO

## 2021-10-27 DIAGNOSIS — R55 Syncope and collapse: Secondary | ICD-10-CM | POA: Diagnosis not present

## 2021-10-27 LAB — ECHOCARDIOGRAM COMPLETE
AR max vel: 2.01 cm2
AV Area VTI: 1.71 cm2
AV Area mean vel: 1.91 cm2
AV Mean grad: 4 mmHg
AV Peak grad: 7 mmHg
Ao pk vel: 1.32 m/s
Area-P 1/2: 4.52 cm2
Calc EF: 61.6 %
S' Lateral: 2.72 cm
Single Plane A2C EF: 61.1 %
Single Plane A4C EF: 65.1 %

## 2021-11-01 ENCOUNTER — Ambulatory Visit (INDEPENDENT_AMBULATORY_CARE_PROVIDER_SITE_OTHER): Payer: Medicare PPO | Admitting: Cardiovascular Disease

## 2021-11-01 DIAGNOSIS — R55 Syncope and collapse: Secondary | ICD-10-CM | POA: Diagnosis not present

## 2021-11-01 DIAGNOSIS — Z95818 Presence of other cardiac implants and grafts: Secondary | ICD-10-CM

## 2021-11-01 MED ORDER — LIDOCAINE HCL (PF) 1 % IJ SOLN: 10.0000 mL | Freq: Once | INTRAMUSCULAR | Status: DC

## 2021-11-01 NOTE — Patient Instructions (Addendum)
Discharge Instructions for  ?Loop Recorder Explant/Implant  ?  ?Follow up: ?Keep your wound check appointment on 5/26 at 10:15. This will be a virtual visit. ? ?If you have any questions or concerns, please call the office at [phone]. ? ?ACTIVITY ?No restrictions. DO wear your seatbelt, even if it crosses over the site. ?  ?WOUND CARE ?Keep the wound area clean and dry.  Remove the dressing the day after (usually 24 hours after the procedure). ?DO NOT SUBMERGE UNDER WATER UNTIL FULLY HEALED (no tub baths, hot tubs, swimming pools, etc.).  ?You  may shower or take a sponge bath after the dressing is removed. DO NOT SOAK the area and do not allow the shower to directly spray on the site. ?If you have tape/steri-strips on your wound, these will fall off; do not pull them off prematurely.   ?No bandage is needed on the site.  DO  NOT apply any creams, oils, or ointments to the wound area. ?If you notice any drainage or discharge from the wound, any swelling, excessive redness or bruising at the site, or if you develop a fever > 101? F, call the office at once at [phone]. ? ? ? ?  ? ?  ?  ?  ?  ? ?

## 2021-11-01 NOTE — Progress Notes (Signed)
LOOP RECORDER IMPLANT ?  ?Procedure report  ?Procedure performed:  ?Loop recorder implantation  ? ?Reason for procedure:  ?1. Syncope ? ?Procedure performed by:  ?Sanda Klein, MD  ?Complications:  ?None  ?Estimated blood loss:  ?<5 mL  ?Medications administered during procedure:  ?Lidocaine 1% with 1/100,000 epinephrine 10 mL locally ?Device details:  ?Medtronic Reveal Linq model number [id number], serial number [id number] ?Procedure details:  ?After the risks and benefits of the procedure were discussed the patient provided informed consent. The patient was prepped and draped in usual sterile fashion. Local anesthesia was administered to an area 2 cm to the left of the sternum in the 4th intercostal space. A cutaneous incision was made using the incision tool. The introducer was then used to create a subcutaneous tunnel and carefully deploy the device. Local pressure was held to ensure hemostasis.  ?The incision was closed with SteriStrips and a sterile dressing was applied.  ?R waves 1.1 mV ? ?Sanda Klein, MD, [facility] ?[place] ?[phone] office ?[phone] pager ?11/01/2021 ?1:20 PM ? ? ?

## 2021-11-12 ENCOUNTER — Telehealth (INDEPENDENT_AMBULATORY_CARE_PROVIDER_SITE_OTHER): Payer: Medicare PPO | Admitting: Cardiovascular Disease

## 2021-11-12 DIAGNOSIS — Z95818 Presence of other cardiac implants and grafts: Secondary | ICD-10-CM

## 2021-11-12 NOTE — Progress Notes (Signed)
Video visit for wound check after implantable loop recorder. Site has healed well.  There is no evidence of redness, tenderness, swelling or drainage, some Steri-Strips are still in place. Device is connected via cell phone app. Remote downloads planned every 30 days.

## 2021-12-06 ENCOUNTER — Ambulatory Visit (INDEPENDENT_AMBULATORY_CARE_PROVIDER_SITE_OTHER): Payer: Medicare PPO

## 2021-12-06 DIAGNOSIS — R55 Syncope and collapse: Secondary | ICD-10-CM | POA: Diagnosis not present

## 2021-12-08 LAB — CUP PACEART REMOTE DEVICE CHECK
Date Time Interrogation Session: 20230618163342
Implantable Pulse Generator Implant Date: 20230515

## 2021-12-27 NOTE — Progress Notes (Signed)
Carelink Summary Report / Loop Recorder 

## 2022-01-10 ENCOUNTER — Ambulatory Visit (INDEPENDENT_AMBULATORY_CARE_PROVIDER_SITE_OTHER): Payer: Medicare PPO

## 2022-01-10 DIAGNOSIS — R55 Syncope and collapse: Secondary | ICD-10-CM

## 2022-01-11 LAB — CUP PACEART REMOTE DEVICE CHECK
Date Time Interrogation Session: 20230721162712
Implantable Pulse Generator Implant Date: 20230515

## 2022-02-04 ENCOUNTER — Ambulatory Visit (INDEPENDENT_AMBULATORY_CARE_PROVIDER_SITE_OTHER): Payer: Medicare PPO | Admitting: Family Medicine

## 2022-02-04 ENCOUNTER — Encounter: Payer: Self-pay | Admitting: Family Medicine

## 2022-02-04 VITALS — BP 138/80 | HR 62 | Temp 99.1°F | Wt 154.1 lb

## 2022-02-04 DIAGNOSIS — S8392XA Sprain of unspecified site of left knee, initial encounter: Secondary | ICD-10-CM

## 2022-02-04 DIAGNOSIS — M25511 Pain in right shoulder: Secondary | ICD-10-CM

## 2022-02-04 DIAGNOSIS — M25512 Pain in left shoulder: Secondary | ICD-10-CM | POA: Diagnosis not present

## 2022-02-04 DIAGNOSIS — G8929 Other chronic pain: Secondary | ICD-10-CM | POA: Diagnosis not present
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
## 2022-02-08 DIAGNOSIS — M25512 Pain in left shoulder: Secondary | ICD-10-CM | POA: Diagnosis not present

## 2022-02-08 DIAGNOSIS — M25511 Pain in right shoulder: Secondary | ICD-10-CM | POA: Diagnosis not present

## 2022-02-08 DIAGNOSIS — M25562 Pain in left knee: Secondary | ICD-10-CM | POA: Diagnosis not present

## 2022-02-14 ENCOUNTER — Ambulatory Visit (INDEPENDENT_AMBULATORY_CARE_PROVIDER_SITE_OTHER): Payer: Medicare PPO

## 2022-02-14 DIAGNOSIS — R55 Syncope and collapse: Secondary | ICD-10-CM | POA: Diagnosis not present

## 2022-02-15 LAB — CUP PACEART REMOTE DEVICE CHECK
Date Time Interrogation Session: 20230823163040
Implantable Pulse Generator Implant Date: 20230515

## 2022-02-18 NOTE — Progress Notes (Signed)
Carelink Summary Report / Loop Recorder 

## 2022-02-24 ENCOUNTER — Encounter: Payer: Medicare PPO | Admitting: Family Medicine

## 2022-03-03 DIAGNOSIS — M25562 Pain in left knee: Secondary | ICD-10-CM | POA: Diagnosis not present

## 2022-03-07 ENCOUNTER — Telehealth: Payer: Self-pay | Admitting: Cardiovascular Disease

## 2022-03-07 NOTE — Telephone Encounter (Signed)
Called patient, advised of message below.   Scheduled for 6 month post syncope- which would be in November. Scheduled for November 30th, 2023.  Patient verbalized understanding.
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
## 2022-03-07 NOTE — Telephone Encounter (Signed)
We can definitely bring in sooner - make it at the 6 month anniversary of the syncope please. OK to have MRI of knee or any other location.

## 2022-03-07 NOTE — Telephone Encounter (Signed)
Pt states that she is needing to have an MRI done but would like to know if she is able to have done being that she has a heart monitor. Please advise

## 2022-03-11 NOTE — Progress Notes (Signed)
Carelink Summary Report / Loop Recorder 

## 2022-03-14 DIAGNOSIS — M25562 Pain in left knee: Secondary | ICD-10-CM | POA: Diagnosis not present

## 2022-03-15 ENCOUNTER — Ambulatory Visit (INDEPENDENT_AMBULATORY_CARE_PROVIDER_SITE_OTHER): Payer: Medicare PPO

## 2022-03-15 DIAGNOSIS — R55 Syncope and collapse: Secondary | ICD-10-CM | POA: Diagnosis not present

## 2022-03-15 LAB — CUP PACEART REMOTE DEVICE CHECK
Date Time Interrogation Session: 20230925163125
Implantable Pulse Generator Implant Date: 20230515

## 2022-03-21 DIAGNOSIS — M25562 Pain in left knee: Secondary | ICD-10-CM | POA: Diagnosis not present

## 2022-03-22 ENCOUNTER — Encounter: Payer: Self-pay | Admitting: Family Medicine

## 2022-03-22 ENCOUNTER — Ambulatory Visit: Payer: Medicare PPO | Admitting: Family Medicine

## 2022-03-22 VITALS — BP 140/90 | HR 92 | Temp 98.4°F | Ht 64.0 in | Wt 156.0 lb

## 2022-03-22 DIAGNOSIS — Z13220 Encounter for screening for lipoid disorders: Secondary | ICD-10-CM

## 2022-03-22 DIAGNOSIS — E039 Hypothyroidism, unspecified: Secondary | ICD-10-CM

## 2022-03-22 DIAGNOSIS — R030 Elevated blood-pressure reading, without diagnosis of hypertension: Secondary | ICD-10-CM | POA: Diagnosis not present
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
## 2022-03-22 NOTE — Assessment & Plan Note (Signed)
On 50 mcg daily, reviewed last TSH, well controlled, no symptoms reported today.

## 2022-03-22 NOTE — Assessment & Plan Note (Addendum)
Rechecked pressure in office at the end of the visit, was 160/90. Patient states that this is chronic for her, but that at home her pressure is normal. I advised that she continue to check her pressures at home, and acutally bring in her BP cuff to her next doctor's appt so it can be compared to the cuff readings in the office. She is due for a lipid screening today as well for CVD risk assessment.

## 2022-03-28 NOTE — Progress Notes (Signed)
Carelink Summary Report / Loop Recorder 

## 2022-04-04 DIAGNOSIS — S83242D Other tear of medial meniscus, current injury, left knee, subsequent encounter: Secondary | ICD-10-CM | POA: Diagnosis not present

## 2022-04-04 DIAGNOSIS — S83412A Sprain of medial collateral ligament of left knee, initial encounter: Secondary | ICD-10-CM | POA: Diagnosis not present

## 2022-04-04 DIAGNOSIS — S83282D Other tear of lateral meniscus, current injury, left knee, subsequent encounter: Secondary | ICD-10-CM | POA: Diagnosis not present

## 2022-04-04 DIAGNOSIS — S83512A Sprain of anterior cruciate ligament of left knee, initial encounter: Secondary | ICD-10-CM | POA: Diagnosis not present

## 2022-04-04 DIAGNOSIS — M2410 Other articular cartilage disorders, unspecified site: Secondary | ICD-10-CM | POA: Diagnosis not present

## 2022-04-05 ENCOUNTER — Telehealth: Payer: Self-pay | Admitting: *Deleted

## 2022-04-05 NOTE — Telephone Encounter (Signed)
   Pre-operative Risk Assessment    Patient Name: Sabrina Gibbs  DOB: December 23, 1955 MRN: [id number]      Request for Surgical Clearance    Procedure:   LEFT KNEE SCOPE PMM/PLM/CHONDROPLASTY  Date of Surgery:  Clearance TBD                                 Surgeon:  DR. Jonn Shingles Surgeon's Group or Practice Name:  [facility] Phone number:  [phone] ATTN: [place] Fax number:  [phone] ATTN: [facility]   Type of Clearance Requested:   - Medical ; NO MEDICATIONS LISTED AS NEEDING TO BE HELD   Type of Anesthesia:  General  WITH KNEE BLOCK   Additional requests/questions:    Jiles Prows   04/05/2022, 9:19 AM

## 2022-04-05 NOTE — Telephone Encounter (Signed)
   Name: Sabrina Gibbs  DOB: 12-03-1955  MRN: [id number]  Primary Cardiologist: Sanda Klein, MD  Chart reviewed as part of pre-operative protocol coverage. The patient has an upcoming visit scheduled with Dr. Sallyanne Kuster on 05/19/2022 at which time clearance can be addressed in case there are any issues that would impact surgical recommendations.  Left knee scope PMM/PLM/Chrondroplasty is not scheduled until TBD as below. I added preop FYI to appointment note so that provider is aware to address at time of outpatient visit.  Per office protocol the cardiology provider should forward their finalized clearance decision and recommendations regarding antiplatelet therapy to the requesting party below.    I will route this message as FYI to requesting party and remove this message from the preop box as separate preop APP input not needed at this time.   Please call with any questions.  [name], NP  04/05/2022, 12:50 PM

## 2022-04-06 ENCOUNTER — Encounter: Payer: Self-pay | Admitting: *Deleted

## 2022-04-06 ENCOUNTER — Telehealth: Payer: Self-pay | Admitting: *Deleted

## 2022-04-06 NOTE — Patient Outreach (Signed)
  Care Coordination   Initial Visit Note   04/06/2022 Name: Aayliah Rotenberry MRN: [id number] DOB: 04-08-1956  Sugey Trevathan is a 66 y.o. year old female who sees Farrel Conners, MD for primary care. I spoke with  Gordy Savers by phone today.  What matters to the patients health and wellness today?  No needs    Goals Addressed               This Visit's Progress     COMPLETED: No needs (pt-stated)        Care Coordination Interventions: Reviewed medications with patient and discussed adherence with all medications and no needed refills Reviewed scheduled/upcoming provider appointments including pending appointments and verified pt completed her AWV for this year Screening for signs and symptoms of depression related to chronic disease state  Assessed social determinant of health barriers          SDOH assessments and interventions completed:  Yes  SDOH Interventions Today    Flowsheet Row Most Recent Value  SDOH Interventions   Food Insecurity Interventions Intervention Not Indicated  Housing Interventions Intervention Not Indicated  Transportation Interventions Intervention Not Indicated  Utilities Interventions Intervention Not Indicated        Care Coordination Interventions Activated:  Yes  Care Coordination Interventions:  Yes, provided   Follow up plan: No further intervention required.   Encounter Outcome:  Pt. Visit Completed   Raina Mina, RN Care Management Coordinator [place] Office [phone]

## 2022-04-06 NOTE — Patient Instructions (Signed)
Visit Information  Thank you for taking time to visit with me today. Please don't hesitate to contact me if I can be of assistance to you.   Following are the goals we discussed today:   Goals Addressed               This Visit's Progress     COMPLETED: No needs (pt-stated)        Care Coordination Interventions: Reviewed medications with patient and discussed adherence with all medications and no needed refills Reviewed scheduled/upcoming provider appointments including pending appointments and verified pt completed her AWV for this year Screening for signs and symptoms of depression related to chronic disease state  Assessed social determinant of health barriers          Please call the care guide team at [phone] if you need to cancel or reschedule your appointment.   If you are experiencing a Mental Health or [place] or need someone to talk to, please call the Suicide and Crisis Lifeline: 988  Patient verbalizes understanding of instructions and care plan provided today and agrees to view in [place]. Active MyChart status and patient understanding of how to access instructions and care plan via MyChart confirmed with patient.     No further follow up required: No needs  Raina Mina, RN Care Management Coordinator [place] Office [phone]

## 2022-04-12 ENCOUNTER — Encounter: Payer: Self-pay | Admitting: Cardiovascular Disease

## 2022-04-14 ENCOUNTER — Ambulatory Visit: Payer: Medicare PPO | Attending: Nurse Practitioner | Admitting: Nurse Practitioner

## 2022-04-14 ENCOUNTER — Encounter: Payer: Self-pay | Admitting: Nurse Practitioner

## 2022-04-14 VITALS — BP 142/98 | HR 94 | Wt 156.0 lb

## 2022-04-14 DIAGNOSIS — Z01810 Encounter for preprocedural cardiovascular examination: Secondary | ICD-10-CM | POA: Diagnosis not present

## 2022-04-14 DIAGNOSIS — I471 Supraventricular tachycardia, unspecified: Secondary | ICD-10-CM

## 2022-04-14 DIAGNOSIS — Z87898 Personal history of other specified conditions: Secondary | ICD-10-CM

## 2022-04-14 DIAGNOSIS — R9431 Abnormal electrocardiogram [ECG] [EKG]: Secondary | ICD-10-CM | POA: Diagnosis not present

## 2022-04-14 DIAGNOSIS — R030 Elevated blood-pressure reading, without diagnosis of hypertension: Secondary | ICD-10-CM

## 2022-04-14 NOTE — Patient Instructions (Signed)
Medication Instructions:  Your physician recommends that you continue on your current medications as directed. Please refer to the Current Medication list given to you today.   *If you need a refill on your cardiac medications before your next appointment, please call your pharmacy*   Lab Work: NONE ordered at this time of appointment   If you have labs (blood work) drawn today and your tests are completely normal, you will receive your results only by: [place] (if you have MyChart) OR A paper copy in the mail If you have any lab test that is abnormal or we need to change your treatment, we will call you to review the results.   Testing/Procedures: NONE ordered at this time of appointment     Follow-Up: At [facility], you and your health needs are our priority.  As part of our continuing mission to provide you with exceptional heart care, we have created designated Provider Care Teams.  These Care Teams include your primary Cardiologist (physician) and Advanced Practice Providers (APPs -  Physician Assistants and Nurse Practitioners) who all work together to provide you with the care you need, when you need it.  We recommend signing up for the patient portal called "MyChart".  Sign up information is provided on this After Visit Summary.  MyChart is used to connect with patients for Virtual Visits (Telemedicine).  Patients are able to view lab/test results, encounter notes, upcoming appointments, etc.  Non-urgent messages can be sent to your provider as well.   To learn more about what you can do with MyChart, go to [email or web address].    Your next appointment:   5-6 month(s)  The format for your next appointment:   In Person  Provider:   Sanda Klein, MD     Other Instructions   Important Information About Sugar
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
## 2022-04-18 ENCOUNTER — Ambulatory Visit (INDEPENDENT_AMBULATORY_CARE_PROVIDER_SITE_OTHER): Payer: Medicare PPO

## 2022-04-18 DIAGNOSIS — Z87898 Personal history of other specified conditions: Secondary | ICD-10-CM

## 2022-04-18 DIAGNOSIS — R55 Syncope and collapse: Secondary | ICD-10-CM | POA: Diagnosis not present

## 2022-04-19 LAB — CUP PACEART REMOTE DEVICE CHECK
Date Time Interrogation Session: 20231028163129
Implantable Pulse Generator Implant Date: 20230515

## 2022-05-03 DIAGNOSIS — M94262 Chondromalacia, left knee: Secondary | ICD-10-CM | POA: Diagnosis not present

## 2022-05-03 DIAGNOSIS — S83272A Complex tear of lateral meniscus, current injury, left knee, initial encounter: Secondary | ICD-10-CM | POA: Diagnosis not present

## 2022-05-03 DIAGNOSIS — G8918 Other acute postprocedural pain: Secondary | ICD-10-CM | POA: Diagnosis not present

## 2022-05-03 DIAGNOSIS — S83232A Complex tear of medial meniscus, current injury, left knee, initial encounter: Secondary | ICD-10-CM | POA: Diagnosis not present

## 2022-05-03 DIAGNOSIS — S83512A Sprain of anterior cruciate ligament of left knee, initial encounter: Secondary | ICD-10-CM | POA: Diagnosis not present

## 2022-05-07 ENCOUNTER — Other Ambulatory Visit (HOSPITAL_BASED_OUTPATIENT_CLINIC_OR_DEPARTMENT_OTHER): Payer: Self-pay | Admitting: Obstetrics & Gynecology

## 2022-05-09 ENCOUNTER — Ambulatory Visit: Payer: Medicare PPO | Admitting: Internal Medicine

## 2022-05-10 DIAGNOSIS — M25562 Pain in left knee: Secondary | ICD-10-CM | POA: Diagnosis not present

## 2022-05-19 ENCOUNTER — Ambulatory Visit: Payer: Medicare PPO | Admitting: Cardiovascular Disease

## 2022-05-21 NOTE — Progress Notes (Signed)
Carelink Summary Report / Loop Recorder 

## 2022-05-23 ENCOUNTER — Ambulatory Visit (INDEPENDENT_AMBULATORY_CARE_PROVIDER_SITE_OTHER): Payer: Medicare PPO

## 2022-05-23 DIAGNOSIS — R55 Syncope and collapse: Secondary | ICD-10-CM

## 2022-05-24 LAB — CUP PACEART REMOTE DEVICE CHECK
Date Time Interrogation Session: 20231203230844
Implantable Pulse Generator Implant Date: 20230515

## 2022-06-24 IMAGING — CT CT ANGIO CHEST
2 of 6 series · 18 of 36 positions shown · IV contrast (agent unspecified)
Comparison: None.

CLINICAL DATA: Syncopal episode.

EXAM:
CT ANGIOGRAPHY CHEST WITH CONTRAST
TECHNIQUE: Multidetector CT imaging of the chest was performed using the
standard protocol during bolus administration of intravenous
contrast. Multiplanar CT image reconstructions and MIPs were
obtained to evaluate the vascular anatomy.

[Series 7: pe thins · axial · 0.66mm/px · z∈[+1144,+1372]mm · 17 of 364 slices shown]
[im 19/364  lung]
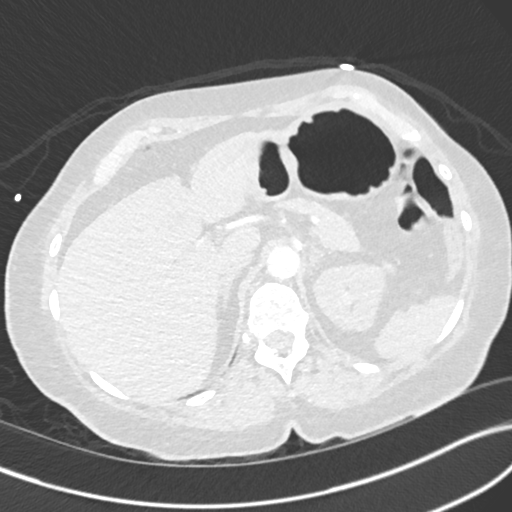
[im 37/364  mediastinal]
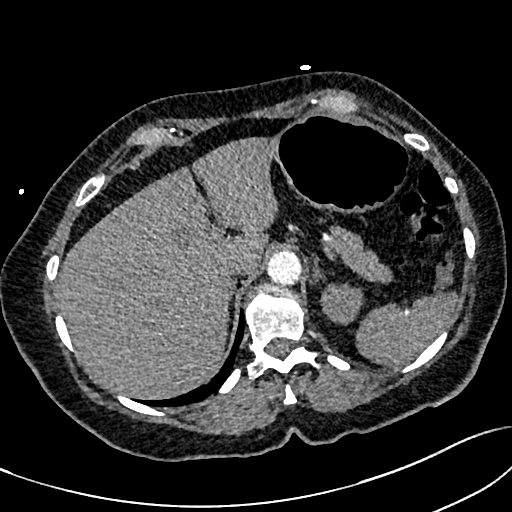
[im 55/364  lung]
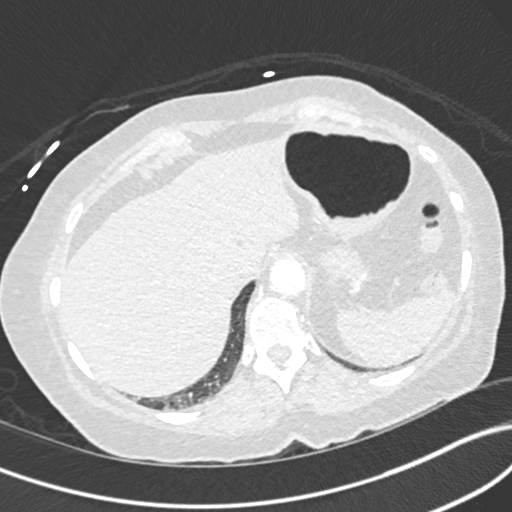
[im 73/364  mediastinal]
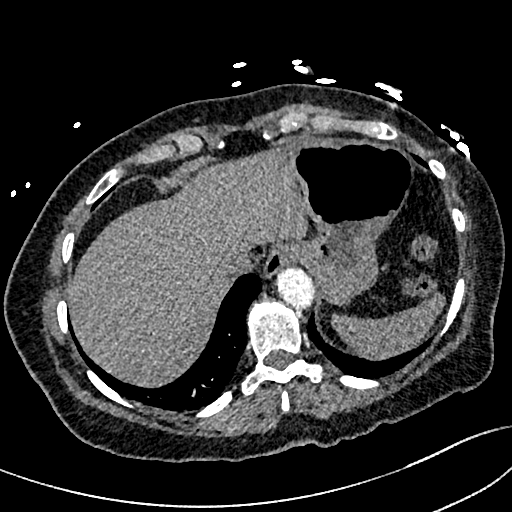
[im 109/364  lung]
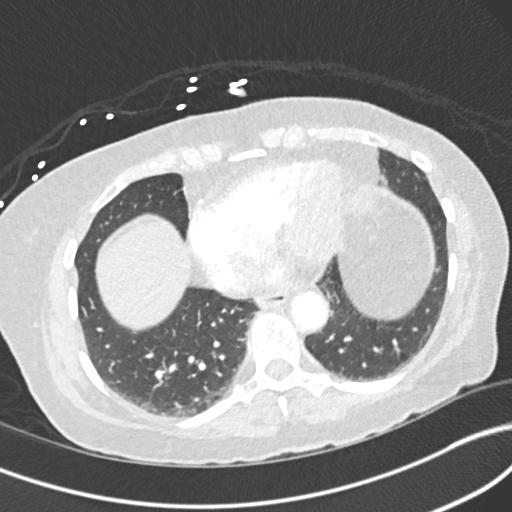
[im 128/364  mediastinal]
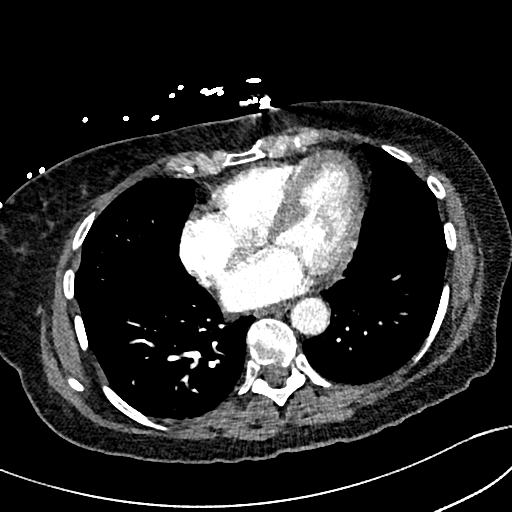
[im 146/364  lung]
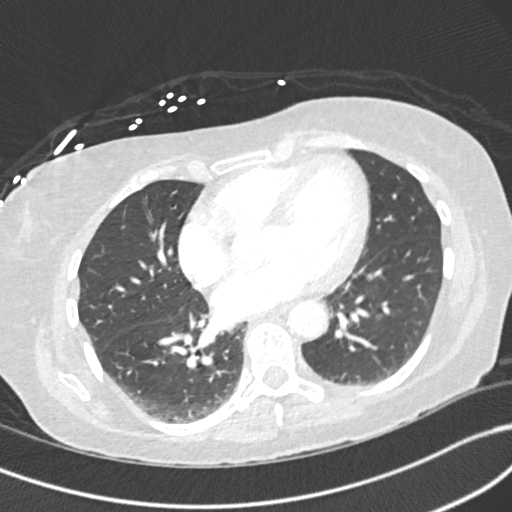
[im 164/364  mediastinal]
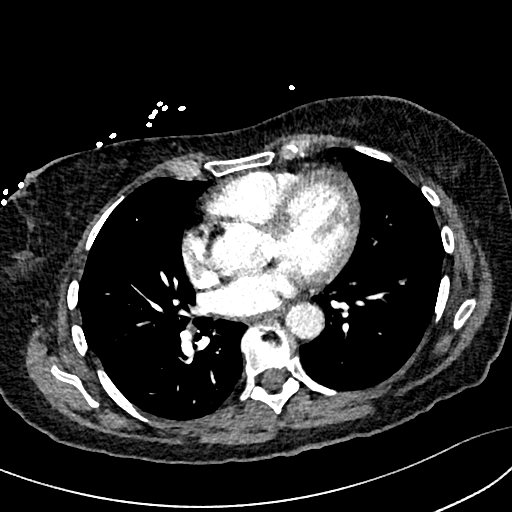
[im 182/364  lung]
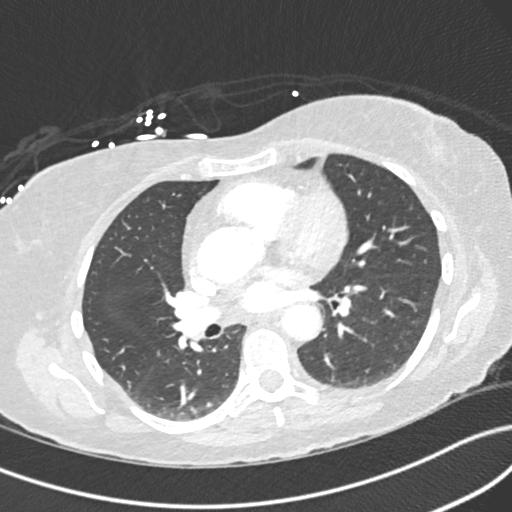
[im 200/364  mediastinal]
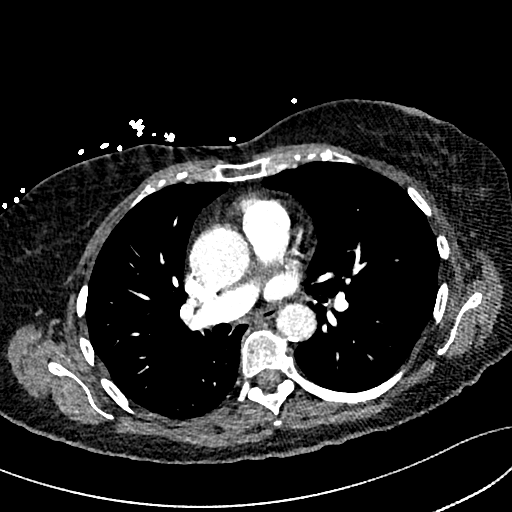
[im 218/364  lung]
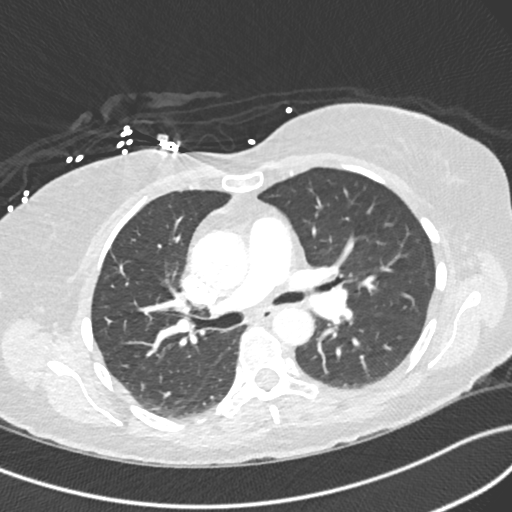
[im 236/364  mediastinal]
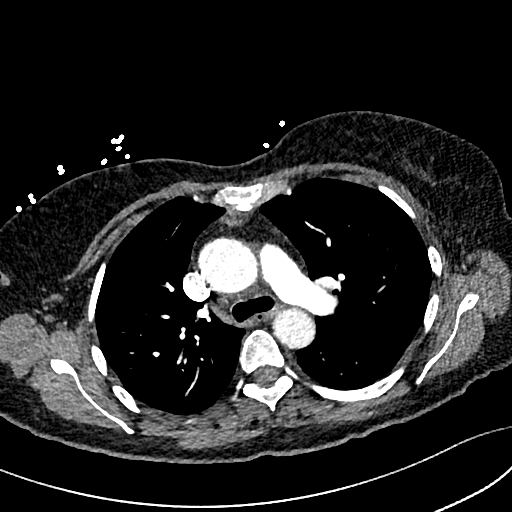
[im 255/364  lung]
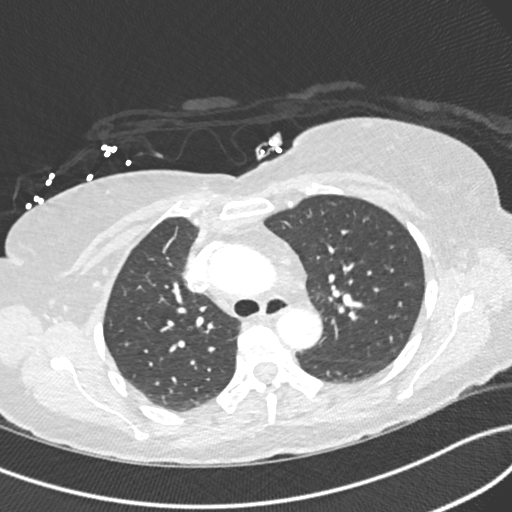
[im 291/364  mediastinal]
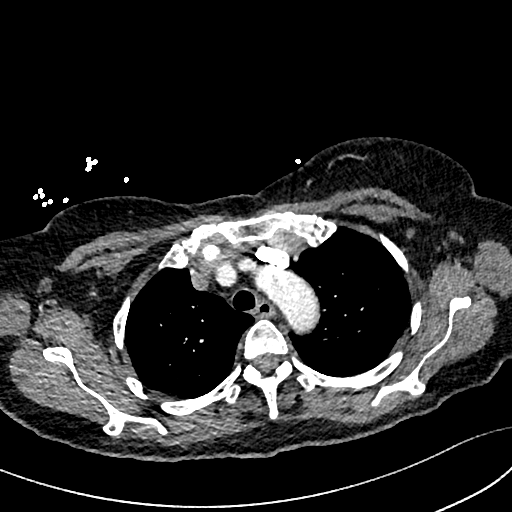
[im 309/364  lung]
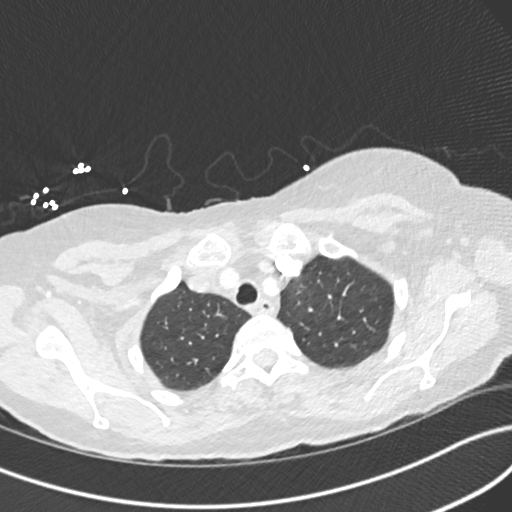
[im 327/364  mediastinal]
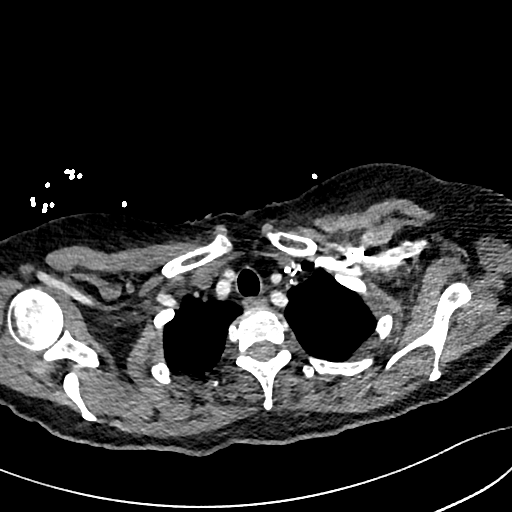
[im 345/364  lung]
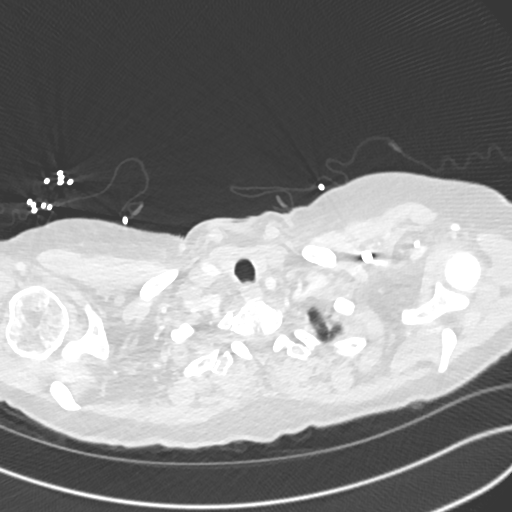

[Series 8: pe 2mm cor · coronal · 0.49mm/px · 1 of 126 slices shown]
[im 63/126  mediastinal]
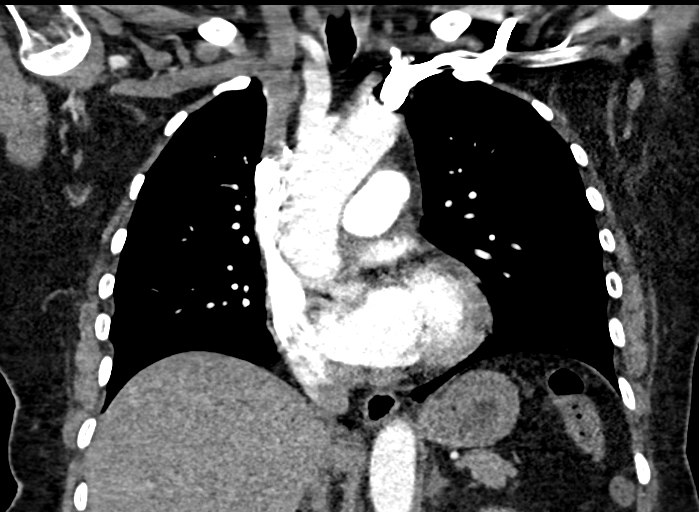

[18 of 36 positions shown; findings below may reference images not displayed]

RADIATION DOSE REDUCTION: This exam was performed according to the
departmental dose-optimization program which includes automated
exposure control, adjustment of the mA and/or kV according to
patient size and/or use of iterative reconstruction technique.

CONTRAST:  50mL OMNIPAQUE IOHEXOL 350 MG/ML SOLN
FINDINGS: Cardiovascular: There is mild calcification of the aortic arch. The
ascending thoracic aorta measures 3.7 mm in diameter. Satisfactory
opacification of the pulmonary arteries to the segmental level. No
evidence of pulmonary embolism. Normal heart size. No pericardial
effusion.

Mediastinum/Nodes: No enlarged mediastinal, hilar, or axillary lymph
nodes. Thyroid gland, trachea, and esophagus demonstrate no
significant findings.

Lungs/Pleura: A 3 mm calcified lung nodule is seen within the
posterior aspect of the right upper lobe.

There is no evidence of acute infiltrate, pleural effusion or
pneumothorax.

Upper Abdomen: No acute abnormality.

Musculoskeletal: No chest wall abnormality. No acute or significant
osseous findings.

Review of the MIP images confirms the above findings.
IMPRESSION: 1. No evidence of pulmonary embolism or other acute cardiopulmonary
disease.
2. Aortic atherosclerosis.

Aortic Atherosclerosis ([id number]-ODA.A).
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
## 2022-06-27 ENCOUNTER — Ambulatory Visit (INDEPENDENT_AMBULATORY_CARE_PROVIDER_SITE_OTHER): Payer: Medicare PPO

## 2022-06-27 DIAGNOSIS — R55 Syncope and collapse: Secondary | ICD-10-CM | POA: Diagnosis not present

## 2022-06-29 LAB — CUP PACEART REMOTE DEVICE CHECK
Date Time Interrogation Session: 20240107230817
Implantable Pulse Generator Implant Date: 20230515

## 2022-07-01 NOTE — Progress Notes (Signed)
[facility] Summary Report / Loop Recorder

## 2022-08-01 ENCOUNTER — Ambulatory Visit: Payer: Medicare PPO

## 2022-08-01 DIAGNOSIS — R55 Syncope and collapse: Secondary | ICD-10-CM

## 2022-08-02 LAB — CUP PACEART REMOTE DEVICE CHECK
Date Time Interrogation Session: 20240209230637
Implantable Pulse Generator Implant Date: 20230515

## 2022-08-08 NOTE — Progress Notes (Signed)
Carelink Summary Report / Loop Recorder 

## 2022-08-30 ENCOUNTER — Other Ambulatory Visit (HOSPITAL_BASED_OUTPATIENT_CLINIC_OR_DEPARTMENT_OTHER): Payer: Self-pay | Admitting: Obstetrics & Gynecology

## 2022-09-02 ENCOUNTER — Ambulatory Visit
Admission: RE | Admit: 2022-09-02 | Discharge: 2022-09-02 | Disposition: A | Discharge status: Home / Self Care | Payer: Medicare PPO | Source: Ambulatory Visit | Attending: Physical Medicine and Rehabilitation | Admitting: Physical Medicine and Rehabilitation

## 2022-09-02 ENCOUNTER — Other Ambulatory Visit: Payer: Self-pay | Admitting: Physical Medicine and Rehabilitation

## 2022-09-02 DIAGNOSIS — Z1231 Encounter for screening mammogram for malignant neoplasm of breast: Secondary | ICD-10-CM | POA: Diagnosis not present

## 2022-09-05 ENCOUNTER — Ambulatory Visit (INDEPENDENT_AMBULATORY_CARE_PROVIDER_SITE_OTHER): Payer: Medicare PPO

## 2022-09-05 DIAGNOSIS — R55 Syncope and collapse: Secondary | ICD-10-CM | POA: Diagnosis not present

## 2022-09-06 ENCOUNTER — Ambulatory Visit: Payer: Medicare PPO | Attending: Cardiovascular Disease | Admitting: Cardiovascular Disease

## 2022-09-06 ENCOUNTER — Encounter: Payer: Self-pay | Admitting: Cardiovascular Disease

## 2022-09-06 VITALS — BP 122/82 | HR 74 | Ht 64.0 in | Wt 159.4 lb

## 2022-09-06 DIAGNOSIS — I4719 Other supraventricular tachycardia: Secondary | ICD-10-CM

## 2022-09-06 DIAGNOSIS — R9431 Abnormal electrocardiogram [ECG] [EKG]: Secondary | ICD-10-CM

## 2022-09-06 DIAGNOSIS — Z95818 Presence of other cardiac implants and grafts: Secondary | ICD-10-CM

## 2022-09-06 DIAGNOSIS — R55 Syncope and collapse: Secondary | ICD-10-CM | POA: Diagnosis not present
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
## 2022-09-06 NOTE — Patient Instructions (Signed)
Medication Instructions:  No changes *If you need a refill on your cardiac medications before your next appointment, please call your pharmacy*  Follow-Up: At [facility], you and your health needs are our priority.  As part of our continuing mission to provide you with exceptional heart care, we have created designated Provider Care Teams.  These Care Teams include your primary Cardiologist (physician) and Advanced Practice Providers (APPs -  Physician Assistants and Nurse Practitioners) who all work together to provide you with the care you need, when you need it.  We recommend signing up for the patient portal called "MyChart".  Sign up information is provided on this After Visit Summary.  MyChart is used to connect with patients for Virtual Visits (Telemedicine).  Patients are able to view lab/test results, encounter notes, upcoming appointments, etc.  Non-urgent messages can be sent to your provider as well.   To learn more about what you can do with MyChart, go to [email or web address].    Your next appointment:    Follow up as needed  Provider:   Mihai Croitoru, MD        

## 2022-09-07 LAB — CUP PACEART REMOTE DEVICE CHECK
Date Time Interrogation Session: 20240317230608
Implantable Pulse Generator Implant Date: 20230515

## 2022-09-11 ENCOUNTER — Encounter: Payer: Self-pay | Admitting: Cardiovascular Disease

## 2022-09-15 NOTE — Progress Notes (Signed)
Carelink Summary Report / Loop Recorder 

## 2022-09-29 DIAGNOSIS — H04123 Dry eye syndrome of bilateral lacrimal glands: Secondary | ICD-10-CM | POA: Diagnosis not present

## 2022-09-29 DIAGNOSIS — H1045 Other chronic allergic conjunctivitis: Secondary | ICD-10-CM | POA: Diagnosis not present

## 2022-09-29 DIAGNOSIS — L719 Rosacea, unspecified: Secondary | ICD-10-CM | POA: Diagnosis not present

## 2022-09-29 DIAGNOSIS — H0102A Squamous blepharitis right eye, upper and lower eyelids: Secondary | ICD-10-CM | POA: Diagnosis not present

## 2022-09-29 DIAGNOSIS — H02831 Dermatochalasis of right upper eyelid: Secondary | ICD-10-CM | POA: Diagnosis not present

## 2022-09-29 DIAGNOSIS — H2513 Age-related nuclear cataract, bilateral: Secondary | ICD-10-CM | POA: Diagnosis not present

## 2022-09-29 DIAGNOSIS — H0102B Squamous blepharitis left eye, upper and lower eyelids: Secondary | ICD-10-CM | POA: Diagnosis not present

## 2022-09-29 DIAGNOSIS — H02834 Dermatochalasis of left upper eyelid: Secondary | ICD-10-CM | POA: Diagnosis not present

## 2022-09-29 DIAGNOSIS — H43813 Vitreous degeneration, bilateral: Secondary | ICD-10-CM | POA: Diagnosis not present

## 2022-10-04 ENCOUNTER — Telehealth: Payer: Self-pay | Admitting: Family Medicine

## 2022-10-04 NOTE — Telephone Encounter (Signed)
Contacted Sabrina Gibbs to schedule their annual wellness visit. Appointment made for 10/13/22.  Sabrina Gibbs AWV direct phone # [phone]   Due to schedule change moved 4/25 awv appt to [name] schedule

## 2022-10-10 ENCOUNTER — Ambulatory Visit (INDEPENDENT_AMBULATORY_CARE_PROVIDER_SITE_OTHER): Payer: Medicare PPO

## 2022-10-10 DIAGNOSIS — R55 Syncope and collapse: Secondary | ICD-10-CM

## 2022-10-11 LAB — CUP PACEART REMOTE DEVICE CHECK
Date Time Interrogation Session: 20240421230235
Implantable Pulse Generator Implant Date: 20230515

## 2022-10-13 ENCOUNTER — Encounter: Payer: Medicare PPO | Admitting: Family Medicine

## 2022-10-13 ENCOUNTER — Ambulatory Visit: Payer: Medicare PPO

## 2022-10-18 NOTE — Progress Notes (Signed)
Carelink Summary Report / Loop Recorder 

## 2022-11-11 DIAGNOSIS — D2261 Melanocytic nevi of right upper limb, including shoulder: Secondary | ICD-10-CM | POA: Diagnosis not present

## 2022-11-11 DIAGNOSIS — L570 Actinic keratosis: Secondary | ICD-10-CM | POA: Diagnosis not present

## 2022-11-11 DIAGNOSIS — L404 Guttate psoriasis: Secondary | ICD-10-CM | POA: Diagnosis not present

## 2022-11-11 DIAGNOSIS — L814 Other melanin hyperpigmentation: Secondary | ICD-10-CM | POA: Diagnosis not present

## 2022-11-11 DIAGNOSIS — L239 Allergic contact dermatitis, unspecified cause: Secondary | ICD-10-CM | POA: Diagnosis not present

## 2022-11-11 DIAGNOSIS — L821 Other seborrheic keratosis: Secondary | ICD-10-CM | POA: Diagnosis not present

## 2022-11-11 DIAGNOSIS — D225 Melanocytic nevi of trunk: Secondary | ICD-10-CM | POA: Diagnosis not present

## 2022-11-11 DIAGNOSIS — Z85828 Personal history of other malignant neoplasm of skin: Secondary | ICD-10-CM | POA: Diagnosis not present

## 2022-11-11 DIAGNOSIS — L718 Other rosacea: Secondary | ICD-10-CM | POA: Diagnosis not present

## 2022-11-15 ENCOUNTER — Ambulatory Visit (INDEPENDENT_AMBULATORY_CARE_PROVIDER_SITE_OTHER): Payer: Medicare PPO

## 2022-11-15 DIAGNOSIS — R55 Syncope and collapse: Secondary | ICD-10-CM | POA: Diagnosis not present

## 2022-11-15 LAB — CUP PACEART REMOTE DEVICE CHECK
Date Time Interrogation Session: 20240524230313
Implantable Pulse Generator Implant Date: 20230515

## 2022-11-16 NOTE — Progress Notes (Signed)
Carelink Summary Report / Loop Recorder 

## 2022-11-30 ENCOUNTER — Other Ambulatory Visit (HOSPITAL_BASED_OUTPATIENT_CLINIC_OR_DEPARTMENT_OTHER): Payer: Self-pay | Admitting: Obstetrics & Gynecology

## 2022-12-06 ENCOUNTER — Encounter: Payer: Medicare PPO | Admitting: Family Medicine

## 2022-12-09 NOTE — Progress Notes (Signed)
Carelink Summary Report / Loop Recorder 

## 2022-12-15 LAB — CUP PACEART REMOTE DEVICE CHECK
Date Time Interrogation Session: 20240626230249
Implantable Pulse Generator Implant Date: 20230515

## 2022-12-19 ENCOUNTER — Ambulatory Visit (INDEPENDENT_AMBULATORY_CARE_PROVIDER_SITE_OTHER): Payer: Medicare PPO

## 2022-12-19 DIAGNOSIS — R55 Syncope and collapse: Secondary | ICD-10-CM | POA: Diagnosis not present

## 2023-01-09 NOTE — Progress Notes (Signed)
Carelink Summary Report / Loop Recorder 

## 2023-01-23 ENCOUNTER — Ambulatory Visit (INDEPENDENT_AMBULATORY_CARE_PROVIDER_SITE_OTHER): Payer: Medicare PPO

## 2023-01-23 DIAGNOSIS — R55 Syncope and collapse: Secondary | ICD-10-CM | POA: Diagnosis not present

## 2023-02-07 NOTE — Progress Notes (Signed)
Carelink Summary Report / Loop Recorder 

## 2023-02-27 ENCOUNTER — Ambulatory Visit: Payer: Medicare PPO

## 2023-02-27 DIAGNOSIS — R55 Syncope and collapse: Secondary | ICD-10-CM

## 2023-02-27 LAB — CUP PACEART REMOTE DEVICE CHECK
Date Time Interrogation Session: 20240906230352
Implantable Pulse Generator Implant Date: 20230515

## 2023-03-05 ENCOUNTER — Other Ambulatory Visit (HOSPITAL_BASED_OUTPATIENT_CLINIC_OR_DEPARTMENT_OTHER): Payer: Self-pay | Admitting: Obstetrics & Gynecology

## 2023-03-06 MED ORDER — LEVOTHYROXINE SODIUM 50 MCG PO TABS: 50.0000 ug | ORAL_TABLET | Freq: Every day | ORAL | 0 refills | Status: DC

## 2023-03-06 NOTE — Telephone Encounter (Signed)
LMOVM for pt to call regarding refill request

## 2023-03-16 NOTE — Progress Notes (Signed)
Carelink Summary Report / Loop Recorder 

## 2023-03-24 ENCOUNTER — Encounter: Payer: Medicare PPO | Admitting: Family Medicine

## 2023-03-27 DIAGNOSIS — Z85828 Personal history of other malignant neoplasm of skin: Secondary | ICD-10-CM | POA: Diagnosis not present

## 2023-03-27 DIAGNOSIS — L718 Other rosacea: Secondary | ICD-10-CM | POA: Diagnosis not present

## 2023-04-03 ENCOUNTER — Ambulatory Visit (INDEPENDENT_AMBULATORY_CARE_PROVIDER_SITE_OTHER): Payer: Medicare PPO

## 2023-04-03 DIAGNOSIS — R55 Syncope and collapse: Secondary | ICD-10-CM | POA: Diagnosis not present

## 2023-04-04 LAB — CUP PACEART REMOTE DEVICE CHECK
Date Time Interrogation Session: 20241013230431
Implantable Pulse Generator Implant Date: 20230515

## 2023-04-13 ENCOUNTER — Encounter: Payer: Self-pay | Admitting: Family Medicine

## 2023-04-13 ENCOUNTER — Ambulatory Visit: Payer: Medicare PPO | Admitting: Family Medicine

## 2023-04-13 VITALS — BP 120/88 | HR 80 | Temp 98.0°F | Ht 64.5 in | Wt 155.8 lb

## 2023-04-13 DIAGNOSIS — Z0000 Encounter for general adult medical examination without abnormal findings: Secondary | ICD-10-CM | POA: Diagnosis not present

## 2023-04-13 DIAGNOSIS — E039 Hypothyroidism, unspecified: Secondary | ICD-10-CM

## 2023-04-13 DIAGNOSIS — Z13220 Encounter for screening for lipoid disorders: Secondary | ICD-10-CM

## 2023-04-13 MED ORDER — LEVOTHYROXINE SODIUM 50 MCG PO TABS: 50.0000 ug | ORAL_TABLET | Freq: Every day | ORAL | 3 refills | Status: DC

## 2023-04-13 NOTE — Patient Instructions (Addendum)
Prevnar 20-- updated pneumonia vaccine  Tetanus booster is due  Health Maintenance, Female Adopting a healthy lifestyle and getting preventive care are important in promoting health and wellness. Ask your health care provider about: The right schedule for you to have regular tests and exams. Things you can do on your own to prevent diseases and keep yourself healthy. What should I know about diet, weight, and exercise? Eat a healthy diet  Eat a diet that includes plenty of vegetables, fruits, low-fat dairy products, and lean protein. Do not eat a lot of foods that are high in solid fats, added sugars, or sodium. Maintain a healthy weight Body mass index (BMI) is used to identify weight problems. It estimates body fat based on height and weight. Your health care provider can help determine your BMI and help you achieve or maintain a healthy weight. Get regular exercise Get regular exercise. This is one of the most important things you can do for your health. Most adults should: Exercise for at least 150 minutes each week. The exercise should increase your heart rate and make you sweat (moderate-intensity exercise). Do strengthening exercises at least twice a week. This is in addition to the moderate-intensity exercise. Spend less time sitting. Even light physical activity can be beneficial. Watch cholesterol and blood lipids Have your blood tested for lipids and cholesterol at 67 years of age, then have this test every 5 years. Have your cholesterol levels checked more often if: Your lipid or cholesterol levels are high. You are older than 67 years of age. You are at high risk for heart disease. What should I know about cancer screening? Depending on your health history and family history, you may need to have cancer screening at various ages. This may include screening for: Breast cancer. Cervical cancer. Colorectal cancer. Skin cancer. Lung cancer. What should I know about heart  disease, diabetes, and high blood pressure? Blood pressure and heart disease High blood pressure causes heart disease and increases the risk of stroke. This is more likely to develop in people who have high blood pressure readings or are overweight. Have your blood pressure checked: Every 3-5 years if you are 28-5 years of age. Every year if you are 39 years old or older. Diabetes Have regular diabetes screenings. This checks your fasting blood sugar level. Have the screening done: Once every three years after age 12 if you are at a normal weight and have a low risk for diabetes. More often and at a younger age if you are overweight or have a high risk for diabetes. What should I know about preventing infection? Hepatitis B If you have a higher risk for hepatitis B, you should be screened for this virus. Talk with your health care provider to find out if you are at risk for hepatitis B infection. Hepatitis C Testing is recommended for: Everyone born from [birth year] through 1965. Anyone with known risk factors for hepatitis C. Sexually transmitted infections (STIs) Get screened for STIs, including gonorrhea and chlamydia, if: You are sexually active and are younger than 67 years of age. You are older than 67 years of age and your health care provider tells you that you are at risk for this type of infection. Your sexual activity has changed since you were last screened, and you are at increased risk for chlamydia or gonorrhea. Ask your health care provider if you are at risk. Ask your health care provider about whether you are at high risk for HIV. Your health  care provider may recommend a prescription medicine to help prevent HIV infection. If you choose to take medicine to prevent HIV, you should first get tested for HIV. You should then be tested every 3 months for as long as you are taking the medicine. Pregnancy If you are about to stop having your period (premenopausal) and you may become  pregnant, seek counseling before you get pregnant. Take 400 to 800 micrograms (mcg) of folic acid every day if you become pregnant. Ask for birth control (contraception) if you want to prevent pregnancy. Osteoporosis and menopause Osteoporosis is a disease in which the bones lose minerals and strength with aging. This can result in bone fractures. If you are 7 years old or older, or if you are at risk for osteoporosis and fractures, ask your health care provider if you should: Be screened for bone loss. Take a calcium or vitamin D supplement to lower your risk of fractures. Be given hormone replacement therapy (HRT) to treat symptoms of menopause. Follow these instructions at home: Alcohol use Do not drink alcohol if: Your health care provider tells you not to drink. You are pregnant, may be pregnant, or are planning to become pregnant. If you drink alcohol: Limit how much you have to: 0-1 drink a day. Know how much alcohol is in your drink. In the [place] one drink equals one 12 oz bottle of beer (355 mL), one 5 oz glass of wine (148 mL), or one 1 oz glass of hard liquor (44 mL). Lifestyle Do not use any products that contain nicotine or tobacco. These products include cigarettes, chewing tobacco, and vaping devices, such as e-cigarettes. If you need help quitting, ask your health care provider. Do not use street drugs. Do not share needles. Ask your health care provider for help if you need support or information about quitting drugs. General instructions Schedule regular health, dental, and eye exams. Stay current with your vaccines. Tell your health care provider if: You often feel depressed. You have ever been abused or do not feel safe at home. Summary Adopting a healthy lifestyle and getting preventive care are important in promoting health and wellness. Follow your health care provider's instructions about healthy diet, exercising, and getting tested or screened for  diseases. Follow your health care provider's instructions on monitoring your cholesterol and blood pressure. This information is not intended to replace advice given to you by your health care provider. Make sure you discuss any questions you have with your health care provider. Document Revised: 10/26/2020 Document Reviewed: 10/26/2020 Elsevier Patient Education  2024 [employer].

## 2023-04-13 NOTE — Progress Notes (Signed)
Complete physical exam  Patient: Sabrina Gibbs   DOB: [birth year]   67 y.o. Female  MRN: [id number]  Subjective:    Chief Complaint  Patient presents with   Annual Exam   Medication Refill    Patient requested PCP take over prescribing Levothyroxine as this was previously managed by OB/GYN    Sabrina Gibbs is a 67 y.o. female who presents today for a complete physical exam. She reports consuming a general diet. Home exercise routine includes walking 3 hrs per week. She generally feels well. She reports sleeping well. She does not have additional problems to discuss today.    Most recent fall risk assessment:    04/13/2023    3:32 PM  Fall Risk   Falls in the past year? 0  Number falls in past yr: 0  Injury with Fall? 0  Risk for fall due to : No Fall Risks  Follow up Falls evaluation completed     Most recent depression screenings:    04/13/2023    3:32 PM 04/06/2022   12:38 PM  PHQ 2/9 Scores  PHQ - 2 Score 0 0  PHQ- 9 Score 0     Vision:Within last year and Dental: No current dental problems and Receives regular dental care  Patient Active Problem List   Diagnosis Date Noted   White coat syndrome without diagnosis of hypertension 03/22/2022   Intramural leiomyoma of uterus 05/12/2021   Thyroid nodule 05/12/2021   Abdominal wall pain 07/01/2020   Psoriasis 06/06/2018   Seasonal allergies 06/06/2018   Hypothyroid 06/25/2009      Patient Care Team: Karie Georges, MD as PCP - General (Family Medicine) Thurmon Fair, MD as PCP - Cardiology (Cardiology) Venancio Poisson, MD as Consulting Physician (Dermatology) Charna Elizabeth, MD as Consulting Physician (Gastroenterology) Jerene Bears, MD as Consulting Physician (Gynecology) Glendale Chard, DO as Consulting Physician (Neurology)   Outpatient Medications Prior to Visit  Medication Sig   aspirin 81 MG chewable tablet Chew 81 mg by mouth daily.   cholecalciferol (VITAMIN D) 1000 units tablet Take  1,000 Units by mouth daily.   Coenzyme Q10 (CO Q 10 PO) Take 200 mg by mouth daily.   COLLAGEN PO Take 100 mg by mouth daily.   fluticasone (FLONASE) 50 MCG/ACT nasal spray Place 2 sprays into both nostrils daily.   hydrocortisone 2.5 % ointment Apply topically.   ibuprofen (ADVIL) 600 MG tablet Take 600 mg by mouth every 6 (six) hours as needed.   triamcinolone cream (KENALOG) 0.1 % Apply topically.   [DISCONTINUED] levothyroxine (SYNTHROID) 50 MCG tablet Take 1 tablet (50 mcg total) by mouth daily.   Facility-Administered Medications Prior to Visit  Medication Dose Route Frequency Provider   lidocaine (PF) (XYLOCAINE) 1 % injection 10 mL  10 mL Intradermal Once Croitoru, Mihai, MD    Review of Systems  HENT:  Negative for hearing loss.   Eyes:  Negative for blurred vision.  Respiratory:  Negative for shortness of breath.   Cardiovascular:  Negative for chest pain.  Gastrointestinal: Negative.   Genitourinary: Negative.   Musculoskeletal:  Negative for back pain.  Neurological:  Negative for headaches.  Psychiatric/Behavioral:  Negative for depression.   All other systems reviewed and are negative.      Objective:     BP 120/88 (BP Location: Left Arm, Patient Position: Sitting, Cuff Size: Normal)   Pulse 80   Temp 98 F (36.7 C) (Oral)   Ht 5' 4.5" (1.638 m)  Wt 155 lb 12.8 oz (70.7 kg)   LMP 10/19/2010   SpO2 97%   BMI 26.33 kg/m    Physical Exam Vitals reviewed.  Constitutional:      Appearance: Normal appearance. She is well-groomed and normal weight.  HENT:     Right Ear: Tympanic membrane and ear canal normal.     Left Ear: Tympanic membrane and ear canal normal.     Mouth/Throat:     Mouth: Mucous membranes are moist.     Pharynx: No posterior oropharyngeal erythema.  Eyes:     Conjunctiva/sclera: Conjunctivae normal.  Neck:     Thyroid: No thyromegaly.  Cardiovascular:     Rate and Rhythm: Normal rate and regular rhythm.     Pulses: Normal pulses.      Heart sounds: S1 normal and S2 normal.  Pulmonary:     Effort: Pulmonary effort is normal.     Breath sounds: Normal breath sounds and air entry.  Abdominal:     General: Abdomen is flat. Bowel sounds are normal.     Palpations: Abdomen is soft.  Musculoskeletal:     Right lower leg: No edema.     Left lower leg: No edema.  Lymphadenopathy:     Cervical: No cervical adenopathy.  Neurological:     Mental Status: She is alert and oriented to person, place, and time. Mental status is at baseline.     Gait: Gait is intact.  Psychiatric:        Mood and Affect: Mood and affect normal.        Speech: Speech normal.        Behavior: Behavior normal.        Judgment: Judgment normal.      No results found for any visits on 04/13/23.     Assessment & Plan:    Routine Health Maintenance and Physical Exam  Immunization History  Administered Date(s) Administered   Fluad Quad(high Dose 65+) 04/17/2019   Influenza Split 04/15/2011   Influenza Whole 06/25/2009   Influenza, Seasonal, Injecte, Preservative Fre 03/13/2014   Influenza,inj,Quad PF,6+ Mos 05/20/2015, 04/25/2017   Influenza-Unspecified 03/31/2021   PFIZER(Purple Top)SARS-COV-2 Vaccination 07/10/2019, 07/31/2019   Pneumococcal Polysaccharide-23 04/17/2019   Tdap 02/28/2011   Zoster Recombinant(Shingrix) 01/19/2020, 04/20/2020    Health Maintenance  Topic Date Due   Pneumonia Vaccine 51+ Years old (2 of 2 - PCV) 10/04/2020   DTaP/Tdap/Td (2 - Td or Tdap) 02/27/2021   Medicare Annual Wellness (AWV)  10/12/2022   COVID-19 Vaccine (3 - 2023-24 season) 04/29/2023 (Originally 02/19/2023)   INFLUENZA VACCINE  09/18/2023 (Originally 01/19/2023)   MAMMOGRAM  09/01/2024   Fecal DNA (Cologuard)  04/12/2025   DEXA SCAN  Completed   Hepatitis C Screening  Completed   Zoster Vaccines- Shingrix  Completed   HPV VACCINES  Aged Out   Colonoscopy  Discontinued    Discussed health benefits of physical activity, and encouraged her  to engage in regular exercise appropriate for her age and condition.  Hypothyroidism, unspecified type -     Levothyroxine Sodium; Take 1 tablet (50 mcg total) by mouth daily.  Dispense: 90 tablet; Refill: 3 -     TSH; Future  Lipid screening -     Lipid panel; Future  Routine general medical examination at a health care facility -     CBC with Differential/Platelet; Future -     Comprehensive metabolic panel; Future   Normal physical exam findings, she is doing very well with diet  and exercise goals, counseled patient on updating her vaccinations. We discussed risks/benefits of each. Handouts given on healthy eating and exercise.  Annual labs are due for surveillance. Orders placed.  Return in 1 year (on 04/12/2024).     Karie Georges, MD

## 2023-04-17 ENCOUNTER — Other Ambulatory Visit: Payer: Medicare PPO

## 2023-04-19 NOTE — Progress Notes (Signed)
Carelink Summary Report / Loop Recorder 

## 2023-05-08 ENCOUNTER — Ambulatory Visit (INDEPENDENT_AMBULATORY_CARE_PROVIDER_SITE_OTHER): Payer: Medicare PPO

## 2023-05-08 DIAGNOSIS — R55 Syncope and collapse: Secondary | ICD-10-CM | POA: Diagnosis not present

## 2023-05-08 LAB — CUP PACEART REMOTE DEVICE CHECK
Date Time Interrogation Session: 20241117232434
Implantable Pulse Generator Implant Date: 20230515

## 2023-06-02 NOTE — Progress Notes (Signed)
Carelink Summary Report / Loop Recorder 

## 2023-06-08 ENCOUNTER — Ambulatory Visit: Payer: Medicare PPO | Admitting: Family Medicine

## 2023-06-08 ENCOUNTER — Encounter: Payer: Self-pay | Admitting: Family Medicine

## 2023-06-08 DIAGNOSIS — Z0000 Encounter for general adult medical examination without abnormal findings: Secondary | ICD-10-CM

## 2023-06-08 NOTE — Progress Notes (Signed)
 Patient unable to obtain vital signs due to telehealth visit

## 2023-06-08 NOTE — Patient Instructions (Addendum)
I really enjoyed getting to talk with you today! I am available on Tuesdays and Thursdays for virtual visits if you have any questions or concerns, or if I can be of any further assistance.   CHECKLIST FROM ANNUAL WELLNESS VISIT:  -Follow up (please call to schedule if not scheduled after visit):   -yearly for annual wellness visit with primary care office  Here is a list of your preventive care/health maintenance measures and the plan for each if any are due:  PLAN For any measures below that may be due:   -can get the vaccines at the pharmacy - please bring copy of receipt to the office so that we can keep you record up to date for you  Health Maintenance  Topic Date Due   Pneumonia Vaccine 3+ Years old (2 of 2 - PCV) 10/04/2020   DTaP/Tdap/Td (2 - Td or Tdap) 02/27/2021   COVID-19 Vaccine (3 - 2024-25 season) 02/19/2023   INFLUENZA VACCINE  09/18/2023 (Originally 01/19/2023)   Medicare Annual Wellness (AWV)  06/07/2024   MAMMOGRAM  09/01/2024   Fecal DNA (Cologuard)  04/12/2025   DEXA SCAN  Completed   Hepatitis C Screening  Completed   Zoster Vaccines- Shingrix  Completed   HPV VACCINES  Aged Out   Colonoscopy  Discontinued    -See a dentist at least yearly  -Get your eyes checked and then per your eye specialist's recommendations  -Other issues addressed today:   Please limit alcohol to no more than 1 drink per 24 hours to minimize toxic effects  -I have included below further information regarding a healthy whole foods based diet, physical activity guidelines for adults, stress management and opportunities for social connections. I hope you find this information useful.   -----------------------------------------------------------------------------------------------------------------------------------------------------------------------------------------------------------------------------------------------------------  NUTRITION: -eat real food: lots of colorful  vegetables (half the plate) and fruits -5-7 servings of vegetables and fruits per day (fresh or steamed is best), exp. 2 servings of vegetables with lunch and dinner and 2 servings of fruit per day. Berries and greens such as kale and collards are great choices.  -consume on a regular basis: whole grains (make sure first ingredient on label contains the word "whole"), fresh fruits, fish, nuts, seeds, healthy oils (such as olive oil, avocado oil, grape seed oil) -may eat small amounts of dairy and lean meat on occasion, but avoid processed meats such as ham, bacon, lunch meat, etc. -drink water -try to avoid fast food and pre-packaged foods, processed meat -most experts advise limiting sodium to < 2300mg  per day, should limit further is any chronic conditions such as high blood pressure, heart disease, diabetes, etc. The American Heart Association advised that < 1500mg  is is ideal -try to avoid foods that contain any ingredients with names you do not recognize  -try to avoid sugar/sweets (except for the natural sugar that occurs in fresh fruit) -try to avoid sweet drinks -try to avoid white rice, white bread, pasta (unless whole grain), white or yellow potatoes  EXERCISE GUIDELINES FOR ADULTS: -if you wish to increase your physical activity, do so gradually and with the approval of your doctor -STOP and seek medical care immediately if you have any chest pain, chest discomfort or trouble breathing when starting or increasing exercise  -move and stretch your body, legs, feet and arms when sitting for long periods -Physical activity guidelines for optimal health in adults: -least 150 minutes per week of aerobic exercise (can talk, but not sing) once approved by your doctor, 20-30 minutes  of sustained activity or two 10 minute episodes of sustained activity every day.  -resistance training at least 2 days per week if approved by your doctor -balance exercises 3+ days per week:   Stand somewhere where  you have something sturdy to hold onto if you lose balance.    1) lift up on toes, start with 5x per day and work up to 20x   2) stand and lift on leg straight out to the side so that foot is a few inches of the floor, start with 5x each side and work up to 20x each side   3) stand on one foot, start with 5 seconds each side and work up to 20 seconds on each side  If you need ideas or help with getting more active:  -Silver sneakers https://tools.silversneakers.com  -Walk with a Doc: http://www.duncan-williams.com/  -try to include resistance (weight lifting/strength building) and balance exercises twice per week: or the following link for ideas: http://castillo-powell.com/  BuyDucts.dk  STRESS MANAGEMENT: -can try meditating, or just sitting quietly with deep breathing while intentionally relaxing all parts of your body for 5 minutes daily -if you need further help with stress, anxiety or depression please follow up with your primary doctor or contact the wonderful folks at [facility]: [phone]  SOCIAL CONNECTIONS: -options in [place] if you wish to engage in more social and exercise related activities:  -Silver sneakers https://tools.silversneakers.com  -Walk with a Doc: http://www.duncan-williams.com/  -Check out the [facility] Active Adults 50+ section on the [place] [employer] (hiking clubs, book clubs, cards and games, chess, exercise classes, aquatic classes and much more) - see the website for details: https://www.[place]-.gov/departments/parks-recreation/active-adults50  -YouTube has lots of exercise videos for different ages and abilities as well  -[facility] (a variety of indoor and outdoor inperson activities for adults). 8024131096. [address]  -Virtual Online Classes (a variety of topics): see seniorplanet.org or call  [phone]) [phone]  -consider volunteering at a school, hospice center, church, senior center or elsewhere

## 2023-06-08 NOTE — Progress Notes (Signed)
67 y.o. G0P0000 Married White or Caucasian female here for breast and pelvic exam.  I am also following her for h/o fibroids.  Has loop recorder due to syncopal episode about 18 months ago.  Didn't drive for six months.  Has not had any issues since that one.    Denies vaginal bleeding.  Patient's last menstrual period was 10/19/2010.           Health Maintenance: PCP:  Dr. Casimiro Needle.  Last wellness appt was 03/2023.  Did blood work at that appt:   Vaccines are up to date:  reviewed Prevenar 20 Colonoscopy:  06/2021 cologuard.  Last colonoscopy was 2013.  MMG:  09/02/2022 Negative BMD:  07/22/2021 Normal Last pap smear:  05/11/2021 Negative.   H/o abnormal pap smear:  no    reports that she has never smoked. She has never used smokeless tobacco. She reports current alcohol use of about 10.0 standard drinks of alcohol per week. She reports that she does not use drugs.  Past Medical History:  Diagnosis Date   Allergy to poison ivy    carried prednisone with her when she has possibility of exposure.    Colon polyp, hyperplastic 2008   Fibroid    Pneumonia    Thyroid disease     Past Surgical History:  Procedure Laterality Date   BASAL CELL CARCINOMA EXCISION     nose; follows with dermatology   KNEE ARTHROSCOPY Left 1999   UPPER GASTROINTESTINAL ENDOSCOPY  03/26/2020   VEIN SURGERY      Current Outpatient Medications  Medication Sig Dispense Refill   aspirin 81 MG chewable tablet Chew 81 mg by mouth daily.     cholecalciferol (VITAMIN D) 1000 units tablet Take 1,000 Units by mouth daily.     Coenzyme Q10 (CO Q 10 PO) Take 200 mg by mouth daily.     COLLAGEN PO Take 100 mg by mouth daily.     fluticasone (FLONASE) 50 MCG/ACT nasal spray Place 2 sprays into both nostrils daily. 16 g 6   hydrocortisone 2.5 % ointment Apply topically.     ibuprofen (ADVIL) 600 MG tablet Take 600 mg by mouth every 6 (six) hours as needed.     levothyroxine (SYNTHROID) 50 MCG tablet Take 1 tablet  (50 mcg total) by mouth daily. 90 tablet 3   triamcinolone cream (KENALOG) 0.1 % Apply topically.     No current facility-administered medications for this visit.    Family History  Problem Relation Age of Onset   High blood pressure Sister    Other Brother        muscular neuropathy secondary to statin; gets infusions   Diabetes Brother    Heart disease Father    Hypertension Father    Varicose Veins Father    Heart disease Mother    Atrial fibrillation Mother    Arrhythmia Mother    Neuropathy Other        diabetic related   Varicose Veins Paternal Aunt    Colon cancer Neg Hx    Esophageal cancer Neg Hx    Stomach cancer Neg Hx    Rectal cancer Neg Hx     Review of Systems  Constitutional: Negative.   Genitourinary: Negative.     Exam:   BP (!) 145/83 (BP Location: Right Arm, Patient Position: Sitting, Cuff Size: Large)   Pulse 77   Ht 5\' 4"  (1.626 m)   Wt 155 lb 3.2 oz (70.4 kg)   LMP 10/19/2010  BMI 26.64 kg/m   Height: 5\' 4"  (162.6 cm)  General appearance: alert, cooperative and appears stated age Breasts: normal appearance, no masses or tenderness Abdomen: soft, non-tender; bowel sounds normal; no masses,  no organomegaly Lymph nodes: Cervical, supraclavicular, and axillary nodes normal.  No abnormal inguinal nodes palpated Neurologic: Grossly normal  Pelvic: External genitalia:  no lesions              Urethra:  normal appearing urethra with no masses, tenderness or lesions              Bartholins and Skenes: normal                 Vagina: normal appearing vagina with atrophic changes and no discharge, no lesions              Cervix: no lesions              Pap taken: Yes.   Bimanual Exam:  Uterus:  enlarged, 8 weeks size, globular, mobile, non tender              Adnexa: no mass, fullness, tenderness               Rectovaginal: Confirms               Anus:  normal sphincter tone, no lesions  Chaperone, Hendricks Milo, CMA, was present for  exam.  Assessment/Plan: 1. Encounter for routine gynecologic examination in Medicare patient (Primary) - Pap smear obtained today with HR HPV - Mammogram 09/02/2022 - Colonoscopy 2013.  Negative cologuard 2023. - Bone mineral density 07/2021 - lab work done with PCP, Dr. Casimiro Needle - vaccines reviewed/updated  2. Cervical cancer screening - Cytology - PAP( [facility]) - PR OBTAINING SCREEN PAP SMEAR  3. Acquired hypothyroidism - on levothyroxine.  Pt aware future labs ordered with PCP but she has not done them.  She will call to schedule  4. Intramural leiomyoma of uterus - stable exam today  5. Postmenopausal - not on HRT

## 2023-06-08 NOTE — Progress Notes (Signed)
PATIENT CHECK-IN and HEALTH RISK ASSESSMENT QUESTIONNAIRE:  -completed by phone/video for upcoming Medicare Preventive Visit   Pre-Visit Check-in: 1)Vitals (height, wt, BP, etc) - record in vitals section for visit on day of visit Request home vitals (wt, BP, etc.) and enter into vitals, THEN update Vital Signs SmartPhrase below at the top of the HPI. See below.  2)Review and Update Medications, Allergies PMH, Surgeries, Social history in Epic 3)Hospitalizations in the last year with date/reason? No  4)Review and Update Care Team (patient's specialists) in Epic 5) Complete PHQ9 in Epic  6) Complete Fall Screening in Epic 7)Review all Health Maintenance Due and order under PCP if not done.  Medicare Wellness Patient Questionnaire:  Answer theses question about your habits: How often do you have a drink containing alcohol?Yes - 3 days per  How many drinks containing alcohol do you have on a typical day when you are drinking?2-3 How often do you have six or more drinks on one occasion?every 5 years Have you ever smoked?No Quit date if applicable? N/A  How many packs a day do/did you smoke? N/A Do you use smokeless tobacco?No Do you use an illicit drugs?No On average, how many days per week do you engage in moderate to strenuous exercise (like a brisk walk)?6-7 days  On average, how many minutes do you engage in exercise at this level?45-60 minutes Are you sexually active? Yes Number of partners?1 Typical breakfast: Varies  Typical lunch: Varies  Typical dinner: Varies  Typical snacks: Candy   Beverages: Diet coke   Answer theses question about your everyday activities: Can you perform most household chores? Yes  Are you deaf or have significant trouble hearing?No Do you feel that you have a problem with memory?No Do you feel safe at home?Yes  Last dentist visit? May 2024  8. Do you have any difficulty performing your everyday activities?No Are you having any difficulty walking,  taking medications on your own, and or difficulty managing daily home needs?No Do you have difficulty walking or climbing stairs?No Do you have difficulty dressing or bathing?No Do you have difficulty doing errands alone such as visiting a doctor's office or shopping?No Do you currently have any difficulty preparing food and eating?No Do you currently have any difficulty using the toilet?No Do you have any difficulty managing your finances?No Do you have any difficulties with housekeeping of managing your housekeeping?No   Do you have Advanced Directives in place (Living Will, Healthcare Power or Attorney)? Yes   Last eye Exam and location? 9 months ago    Do you currently use prescribed or non-prescribed narcotic or opioid pain medications? No  Do you have a history or close family history of breast, ovarian, tubal or peritoneal cancer or a family member with BRCA (breast cancer susceptibility 1 and 2) gene mutations? No    Nurse/Assistant Credentials/time stamp: Mg 10:17 AM    ----------------------------------------------------------------------------------------------------------------------------------------------------------------------------------------------------------------------  Because this visit was a virtual/telehealth visit, some criteria may be missing or patient reported. Any vitals not documented were not able to be obtained and vitals that have been documented are patient reported.    MEDICARE ANNUAL PREVENTIVE VISIT WITH PROVIDER: (Welcome to Medicare, initial annual wellness or annual wellness exam)  Virtual Visit via Phone Note  I connected with Sabrina Gibbs on 06/08/23 by phone application and verified that I am speaking with the correct person using two identifiers.  Location patient: home Location provider:work or home office Persons participating in the virtual visit: patient, provider  Concerns and/or follow  up today: no concerns, reports  stable   See HM section in Epic for other details of completed HM.    ROS: negative for report of fevers, unintentional weight loss, vision changes, vision loss, hearing loss or change, chest pain, sob, hemoptysis, melena, hematochezia, hematuria, falls, bleeding or bruising, thoughts of suicide or self harm, memory loss  Patient-completed extensive health risk assessment - reviewed and discussed with the patient: See Health Risk Assessment completed with patient prior to the visit either above or in recent phone note. This was reviewed in detailed with the patient today and appropriate recommendations, orders and referrals were placed as needed per Summary below and patient instructions.   Review of Medical History: -PMH, PSH, Family History and current specialty and care providers reviewed and updated and listed below   Patient Care Team: Karie Georges, MD as PCP - General (Family Medicine) Croitoru, Rachelle Hora, MD as PCP - Cardiology (Cardiology) Venancio Poisson, MD as Consulting Physician (Dermatology) Charna Elizabeth, MD as Consulting Physician (Gastroenterology) Jerene Bears, MD as Consulting Physician (Gynecology) Glendale Chard, DO as Consulting Physician (Neurology)   Past Medical History:  Diagnosis Date   Allergy to poison ivy    carried prednisone with her when she has possibility of exposure.    Colon polyp, hyperplastic 2008   Fibroid    Pneumonia    Thyroid disease     Past Surgical History:  Procedure Laterality Date   BASAL CELL CARCINOMA EXCISION     nose; follows with dermatology   KNEE ARTHROSCOPY Left 1999   UPPER GASTROINTESTINAL ENDOSCOPY  03/26/2020   VEIN SURGERY      Social History   Socioeconomic History   Marital status: Married    Spouse name: Not on file   Number of children: 0   Years of education: Not on file   Highest education level: Not on file  Occupational History   Occupation: Retired Runner, broadcasting/film/video  Tobacco Use   Smoking status: Never    Smokeless tobacco: Never  Vaping Use   Vaping status: Never Used  Substance and Sexual Activity   Alcohol use: Yes    Alcohol/week: 10.0 standard drinks of alcohol    Types: 10 Glasses of wine per week    Comment: 2 glasses of wine per night and a glass of bourbon   Drug use: No   Sexual activity: Yes    Partners: Male    Birth control/protection: Post-menopausal  Other Topics Concern   Not on file  Social History Narrative   Not on file   Social Drivers of Health   Financial Resource Strain: Low Risk  (06/08/2023)   Overall Financial Resource Strain (CARDIA)    Difficulty of Paying Living Expenses: Not hard at all  Food Insecurity: No Food Insecurity (06/08/2023)   Hunger Vital Sign    Worried About Running Out of Food in the Last Year: Never true    Ran Out of Food in the Last Year: Never true  Transportation Needs: No Transportation Needs (06/08/2023)   PRAPARE - Administrator, Civil Service (Medical): No    Lack of Transportation (Non-Medical): No  Physical Activity: Sufficiently Active (06/08/2023)   Exercise Vital Sign    Days of Exercise per Week: 7 days    Minutes of Exercise per Session: 60 min  Stress: Stress Concern Present (06/08/2023)   [employer] of Occupational Health - Occupational Stress Questionnaire    Feeling of Stress : To some extent  Social  Connections: Moderately Integrated (06/08/2023)   Social Connection and Isolation Panel [NHANES]    Frequency of Communication with Friends and Family: More than three times a week    Frequency of Social Gatherings with Friends and Family: More than three times a week    Attends Religious Services: More than 4 times per year    Active Member of [employer] or Organizations: No    Attends Banker Meetings: Never    Marital Status: Married  Catering manager Violence: Not At Risk (06/08/2023)   Humiliation, Afraid, Rape, and Kick questionnaire    Fear of Current or Ex-Partner: No     Emotionally Abused: No    Physically Abused: No    Sexually Abused: No    Family History  Problem Relation Age of Onset   High blood pressure Sister    Other Brother        muscular neuropathy secondary to statin; gets infusions   Diabetes Brother    Heart disease Father    Hypertension Father    Varicose Veins Father    Heart disease Mother    Atrial fibrillation Mother    Arrhythmia Mother    Neuropathy Other        diabetic related   Varicose Veins Paternal Aunt    Colon cancer Neg Hx    Esophageal cancer Neg Hx    Stomach cancer Neg Hx    Rectal cancer Neg Hx     Current Outpatient Medications on File Prior to Visit  Medication Sig Dispense Refill   aspirin 81 MG chewable tablet Chew 81 mg by mouth daily.     cholecalciferol (VITAMIN D) 1000 units tablet Take 1,000 Units by mouth daily.     Coenzyme Q10 (CO Q 10 PO) Take 200 mg by mouth daily.     COLLAGEN PO Take 100 mg by mouth daily.     fluticasone (FLONASE) 50 MCG/ACT nasal spray Place 2 sprays into both nostrils daily. 16 g 6   hydrocortisone 2.5 % ointment Apply topically.     ibuprofen (ADVIL) 600 MG tablet Take 600 mg by mouth every 6 (six) hours as needed.     levothyroxine (SYNTHROID) 50 MCG tablet Take 1 tablet (50 mcg total) by mouth daily. 90 tablet 3   triamcinolone cream (KENALOG) 0.1 % Apply topically.     Current Facility-Administered Medications on File Prior to Visit  Medication Dose Route Frequency Provider Last Rate Last Admin   lidocaine (PF) (XYLOCAINE) 1 % injection 10 mL  10 mL Intradermal Once Croitoru, Mihai, MD        No Known Allergies     Physical Exam Vitals requested from patient and listed below if patient had equipment and was able to obtain at home for this virtual visit: There were no vitals filed for this visit. Estimated body mass index is 26.33 kg/m as calculated from the following:   Height as of 04/13/23: 5' 4.5" (1.638 m).   Weight as of 04/13/23: 155 lb 12.8 oz (70.7  kg).  EKG (optional): deferred due to virtual visit  GENERAL: alert, oriented, no acute distress detected, full vision exam deferred due to pandemic and/or virtual encounter  PSYCH/NEURO: pleasant and cooperative, no obvious depression or anxiety, speech and thought processing grossly intact, Cognitive function grossly intact  Flowsheet Row Office Visit from 06/08/2023 in [facility] [place]  PHQ-9 Total Score 0           06/08/2023   10:06  AM 04/13/2023    3:32 PM 04/06/2022   12:38 PM 03/22/2022   12:55 PM 02/04/2022    2:24 PM  Depression screen PHQ 2/9  Decreased Interest 0 0 0 0 0  Down, Depressed, Hopeless 0 0 0 0 0  PHQ - 2 Score 0 0 0 0 0  Altered sleeping 0 0  1 0  Tired, decreased energy 0 0  0 0  Change in appetite 0 0  0 0  Feeling bad or failure about yourself  0 0  0 0  Trouble concentrating 0 0  0 0  Moving slowly or fidgety/restless 0 0  0 0  Suicidal thoughts 0 0  0 0  PHQ-9 Score 0 0  1 0  Difficult doing work/chores Not difficult at all   Not difficult at all Not difficult at all       10/15/2021    2:33 PM 02/04/2022    2:23 PM 04/13/2023    3:00 PM 04/13/2023    3:32 PM 06/08/2023   10:07 AM  Fall Risk  Falls in the past year?  1 0 0 0  Was there an injury with Fall?  1 0 0 0  Fall Risk Category Calculator  2 0 0 0  Fall Risk Category (Retired)  Moderate     (RETIRED) Patient Fall Risk Level Low fall risk Low fall risk     Patient at Risk for Falls Due to  No Fall Risks No Fall Risks No Fall Risks No Fall Risks  Fall risk Follow up  Falls evaluation completed Falls evaluation completed Falls evaluation completed Falls evaluation completed     SUMMARY AND PLAN:  Encounter for Medicare annual wellness exam  Discussed applicable health maintenance/preventive health measures and advised and referred or ordered per patient preferences: -she declines flu shot for this year -considering other vaccines and plans to get at  the pharmacy Health Maintenance  Topic Date Due   Pneumonia Vaccine 53+ Years old (2 of 2 - PCV) 10/04/2020   DTaP/Tdap/Td (2 - Td or Tdap) 02/27/2021   COVID-19 Vaccine (3 - 2024-25 season) 02/19/2023   INFLUENZA VACCINE  09/18/2023 (Originally 01/19/2023)   Medicare Annual Wellness (AWV)  06/07/2024   MAMMOGRAM  09/01/2024   Fecal DNA (Cologuard)  04/12/2025   DEXA SCAN  Completed   Hepatitis C Screening  Completed   Zoster Vaccines- Shingrix  Completed   HPV VACCINES  Aged Out   Colonoscopy  Discontinued    Education and counseling on the following was provided based on the above review of health and a plan/checklist for the patient, along with additional information discussed, was provided for the patient in the patient instructions : -Advised and counseled on a healthy lifestyle - including the importance of a healthy diet, regular physical activity, social connections and stress management. -Reviewed patient's current diet. Advised and counseled on a whole foods based healthy diet. A summary of a healthy diet was provided in the Patient Instructions.  -reviewed patient's current physical activity level and discussed exercise guidelines for adults. Discussed community resources and ideas for safe exercise at home to assist in meeting exercise guideline recommendations in a safe and healthy way.  -Advise yearly dental visits at minimum and regular eye exams -Advised and counseled on alcohol safe limits, risks - advised no more than one drink per 24 hours Follow up: see patient instructions     Patient Instructions  I really enjoyed getting to talk with you today!  I am available on Tuesdays and Thursdays for virtual visits if you have any questions or concerns, or if I can be of any further assistance.   CHECKLIST FROM ANNUAL WELLNESS VISIT:  -Follow up (please call to schedule if not scheduled after visit):   -yearly for annual wellness visit with primary care office  Here is a  list of your preventive care/health maintenance measures and the plan for each if any are due:  PLAN For any measures below that may be due:   -can get the vaccines at the pharmacy - please bring copy of receipt to the office so that we can keep you record up to date for you  Health Maintenance  Topic Date Due   Pneumonia Vaccine 29+ Years old (2 of 2 - PCV) 10/04/2020   DTaP/Tdap/Td (2 - Td or Tdap) 02/27/2021   COVID-19 Vaccine (3 - 2024-25 season) 02/19/2023   INFLUENZA VACCINE  09/18/2023 (Originally 01/19/2023)   Medicare Annual Wellness (AWV)  06/07/2024   MAMMOGRAM  09/01/2024   Fecal DNA (Cologuard)  04/12/2025   DEXA SCAN  Completed   Hepatitis C Screening  Completed   Zoster Vaccines- Shingrix  Completed   HPV VACCINES  Aged Out   Colonoscopy  Discontinued    -See a dentist at least yearly  -Get your eyes checked and then per your eye specialist's recommendations  -Other issues addressed today:   Please limit alcohol to no more than 1 drink per 24 hours to minimize toxic effects  -I have included below further information regarding a healthy whole foods based diet, physical activity guidelines for adults, stress management and opportunities for social connections. I hope you find this information useful.   -----------------------------------------------------------------------------------------------------------------------------------------------------------------------------------------------------------------------------------------------------------  NUTRITION: -eat real food: lots of colorful vegetables (half the plate) and fruits -5-7 servings of vegetables and fruits per day (fresh or steamed is best), exp. 2 servings of vegetables with lunch and dinner and 2 servings of fruit per day. Berries and greens such as kale and collards are great choices.  -consume on a regular basis: whole grains (make sure first ingredient on label contains the word "whole"), fresh  fruits, fish, nuts, seeds, healthy oils (such as olive oil, avocado oil, grape seed oil) -may eat small amounts of dairy and lean meat on occasion, but avoid processed meats such as ham, bacon, lunch meat, etc. -drink water -try to avoid fast food and pre-packaged foods, processed meat -most experts advise limiting sodium to < 2300mg  per day, should limit further is any chronic conditions such as high blood pressure, heart disease, diabetes, etc. The American Heart Association advised that < 1500mg  is is ideal -try to avoid foods that contain any ingredients with names you do not recognize  -try to avoid sugar/sweets (except for the natural sugar that occurs in fresh fruit) -try to avoid sweet drinks -try to avoid white rice, white bread, pasta (unless whole grain), white or yellow potatoes  EXERCISE GUIDELINES FOR ADULTS: -if you wish to increase your physical activity, do so gradually and with the approval of your doctor -STOP and seek medical care immediately if you have any chest pain, chest discomfort or trouble breathing when starting or increasing exercise  -move and stretch your body, legs, feet and arms when sitting for long periods -Physical activity guidelines for optimal health in adults: -least 150 minutes per week of aerobic exercise (can talk, but not sing) once approved by your doctor, 20-30 minutes of sustained activity or two 10 minute episodes  of sustained activity every day.  -resistance training at least 2 days per week if approved by your doctor -balance exercises 3+ days per week:   Stand somewhere where you have something sturdy to hold onto if you lose balance.    1) lift up on toes, start with 5x per day and work up to 20x   2) stand and lift on leg straight out to the side so that foot is a few inches of the floor, start with 5x each side and work up to 20x each side   3) stand on one foot, start with 5 seconds each side and work up to 20 seconds on each side  If you  need ideas or help with getting more active:  -Silver sneakers https://tools.silversneakers.com  -Walk with a Doc: http://www.duncan-williams.com/  -try to include resistance (weight lifting/strength building) and balance exercises twice per week: or the following link for ideas: http://castillo-powell.com/  BuyDucts.dk  STRESS MANAGEMENT: -can try meditating, or just sitting quietly with deep breathing while intentionally relaxing all parts of your body for 5 minutes daily -if you need further help with stress, anxiety or depression please follow up with your primary doctor or contact the wonderful folks at [facility]: [phone]  SOCIAL CONNECTIONS: -options in [place] if you wish to engage in more social and exercise related activities:  -Silver sneakers https://tools.silversneakers.com  -Walk with a Doc: http://www.duncan-williams.com/  -Check out the [facility] Active Adults 50+ section on the [place] of [employer] (hiking clubs, book clubs, cards and games, chess, exercise classes, aquatic classes and much more) - see the website for details: https://www.[place]-.gov/departments/parks-recreation/active-adults50  -YouTube has lots of exercise videos for different ages and abilities as well  -[facility] (a variety of indoor and outdoor inperson activities for adults). 8286935217. [address]  -Virtual Online Classes (a variety of topics): see seniorplanet.org or call 7347747558  -consider volunteering at a school, hospice center, church, senior center or elsewhere           Terressa Koyanagi, DO

## 2023-06-09 ENCOUNTER — Ambulatory Visit (HOSPITAL_BASED_OUTPATIENT_CLINIC_OR_DEPARTMENT_OTHER): Payer: Medicare PPO | Admitting: Obstetrics & Gynecology

## 2023-06-09 ENCOUNTER — Encounter (HOSPITAL_BASED_OUTPATIENT_CLINIC_OR_DEPARTMENT_OTHER): Payer: Self-pay | Admitting: Obstetrics & Gynecology

## 2023-06-09 ENCOUNTER — Other Ambulatory Visit (HOSPITAL_COMMUNITY)
Admission: RE | Admit: 2023-06-09 | Discharge: 2023-06-09 | Disposition: A | Discharge status: Home / Self Care | Payer: Medicare PPO | Source: Ambulatory Visit | Attending: Obstetrics & Gynecology | Admitting: Obstetrics & Gynecology

## 2023-06-09 VITALS — BP 145/83 | HR 77 | Ht 64.0 in | Wt 155.2 lb

## 2023-06-09 DIAGNOSIS — Z124 Encounter for screening for malignant neoplasm of cervix: Secondary | ICD-10-CM | POA: Diagnosis not present

## 2023-06-09 DIAGNOSIS — Z01419 Encounter for gynecological examination (general) (routine) without abnormal findings: Secondary | ICD-10-CM

## 2023-06-09 DIAGNOSIS — Z23 Encounter for immunization: Secondary | ICD-10-CM

## 2023-06-09 DIAGNOSIS — E039 Hypothyroidism, unspecified: Secondary | ICD-10-CM | POA: Diagnosis not present

## 2023-06-09 DIAGNOSIS — Z780 Asymptomatic menopausal state: Secondary | ICD-10-CM

## 2023-06-09 DIAGNOSIS — D251 Intramural leiomyoma of uterus: Secondary | ICD-10-CM | POA: Diagnosis not present

## 2023-06-09 NOTE — Patient Instructions (Signed)
The second pneumonia vaccination is Prevnar 20

## 2023-06-12 ENCOUNTER — Ambulatory Visit (INDEPENDENT_AMBULATORY_CARE_PROVIDER_SITE_OTHER): Payer: Medicare PPO

## 2023-06-12 DIAGNOSIS — R55 Syncope and collapse: Secondary | ICD-10-CM

## 2023-06-13 LAB — CUP PACEART REMOTE DEVICE CHECK
Date Time Interrogation Session: 20241222231354
Implantable Pulse Generator Implant Date: 20230515

## 2023-06-13 LAB — CYTOLOGY - PAP: Diagnosis: NEGATIVE

## 2023-07-17 ENCOUNTER — Ambulatory Visit (INDEPENDENT_AMBULATORY_CARE_PROVIDER_SITE_OTHER): Payer: Medicare PPO

## 2023-07-17 DIAGNOSIS — R55 Syncope and collapse: Secondary | ICD-10-CM | POA: Diagnosis not present

## 2023-07-17 LAB — CUP PACEART REMOTE DEVICE CHECK
Date Time Interrogation Session: 20250126231717
Implantable Pulse Generator Implant Date: 20230515

## 2023-07-22 ENCOUNTER — Encounter: Payer: Self-pay | Admitting: Cardiovascular Disease

## 2023-07-24 NOTE — Addendum Note (Signed)
Addended by: Geralyn Flash D on: 07/24/2023 10:38 AM   Modules accepted: Orders

## 2023-07-24 NOTE — Progress Notes (Signed)
 [facility] Summary Report / Loop Recorder

## 2023-08-21 ENCOUNTER — Ambulatory Visit (INDEPENDENT_AMBULATORY_CARE_PROVIDER_SITE_OTHER): Payer: Medicare PPO

## 2023-08-21 DIAGNOSIS — R55 Syncope and collapse: Secondary | ICD-10-CM

## 2023-08-23 LAB — CUP PACEART REMOTE DEVICE CHECK
Date Time Interrogation Session: 20250302231028
Implantable Pulse Generator Implant Date: 20230515

## 2023-08-27 ENCOUNTER — Encounter: Payer: Self-pay | Admitting: Cardiovascular Disease

## 2023-08-28 NOTE — Progress Notes (Signed)
 [facility] Summary Report / Loop Recorder

## 2023-09-25 ENCOUNTER — Ambulatory Visit (INDEPENDENT_AMBULATORY_CARE_PROVIDER_SITE_OTHER): Payer: Medicare PPO

## 2023-09-25 DIAGNOSIS — R55 Syncope and collapse: Secondary | ICD-10-CM

## 2023-09-25 NOTE — Addendum Note (Signed)
 Addended by: Geralyn Flash D on: 09/25/2023 10:43 AM   Modules accepted: Orders

## 2023-09-25 NOTE — Progress Notes (Signed)
 [facility] Summary Report / Loop Recorder

## 2023-09-26 LAB — CUP PACEART REMOTE DEVICE CHECK
Date Time Interrogation Session: 20250406231252
Implantable Pulse Generator Implant Date: 20230515

## 2023-10-07 ENCOUNTER — Encounter: Payer: Self-pay | Admitting: Cardiovascular Disease

## 2023-10-30 ENCOUNTER — Ambulatory Visit (INDEPENDENT_AMBULATORY_CARE_PROVIDER_SITE_OTHER): Payer: Medicare PPO

## 2023-10-30 DIAGNOSIS — R55 Syncope and collapse: Secondary | ICD-10-CM

## 2023-10-31 ENCOUNTER — Ambulatory Visit: Payer: Self-pay | Admitting: Cardiovascular Disease

## 2023-10-31 LAB — CUP PACEART REMOTE DEVICE CHECK
Date Time Interrogation Session: 20250511233739
Implantable Pulse Generator Implant Date: 20230515

## 2023-11-15 NOTE — Progress Notes (Signed)
 [facility] Summary Report / Loop Recorder

## 2023-11-15 NOTE — Addendum Note (Signed)
 Addended by: Edra Govern D on: 11/15/2023 12:03 PM   Modules accepted: Orders

## 2023-11-30 ENCOUNTER — Ambulatory Visit

## 2023-11-30 ENCOUNTER — Ambulatory Visit (INDEPENDENT_AMBULATORY_CARE_PROVIDER_SITE_OTHER)

## 2023-11-30 DIAGNOSIS — R55 Syncope and collapse: Secondary | ICD-10-CM

## 2023-11-30 LAB — CUP PACEART REMOTE DEVICE CHECK
Date Time Interrogation Session: 20250611232239
Implantable Pulse Generator Implant Date: 20230515

## 2023-12-04 ENCOUNTER — Ambulatory Visit: Payer: Medicare PPO

## 2023-12-11 ENCOUNTER — Ambulatory Visit: Payer: Self-pay | Admitting: Cardiovascular Disease

## 2023-12-19 NOTE — Addendum Note (Signed)
 Addended by: TAWNI DRILLING D on: 12/19/2023 11:55 AM   Modules accepted: Orders

## 2023-12-19 NOTE — Progress Notes (Signed)
 [facility] Summary Report / Loop Recorder

## 2024-01-01 ENCOUNTER — Ambulatory Visit

## 2024-01-01 ENCOUNTER — Ambulatory Visit (INDEPENDENT_AMBULATORY_CARE_PROVIDER_SITE_OTHER)

## 2024-01-01 ENCOUNTER — Ambulatory Visit: Payer: Self-pay | Admitting: Cardiovascular Disease

## 2024-01-01 DIAGNOSIS — R55 Syncope and collapse: Secondary | ICD-10-CM

## 2024-01-01 LAB — CUP PACEART REMOTE DEVICE CHECK
Date Time Interrogation Session: 20250713233628
Implantable Pulse Generator Implant Date: 20230515

## 2024-01-08 ENCOUNTER — Ambulatory Visit: Payer: Medicare PPO

## 2024-01-24 NOTE — Progress Notes (Signed)
 [facility] Summary Report / Loop Recorder

## 2024-02-01 ENCOUNTER — Ambulatory Visit (INDEPENDENT_AMBULATORY_CARE_PROVIDER_SITE_OTHER)

## 2024-02-01 ENCOUNTER — Ambulatory Visit

## 2024-02-01 DIAGNOSIS — R55 Syncope and collapse: Secondary | ICD-10-CM

## 2024-02-01 LAB — CUP PACEART REMOTE DEVICE CHECK
Date Time Interrogation Session: 20250813232530
Implantable Pulse Generator Implant Date: 20230515

## 2024-02-02 ENCOUNTER — Ambulatory Visit: Payer: Self-pay | Admitting: Cardiovascular Disease

## 2024-02-07 DIAGNOSIS — H02831 Dermatochalasis of right upper eyelid: Secondary | ICD-10-CM | POA: Diagnosis not present

## 2024-02-07 DIAGNOSIS — H02834 Dermatochalasis of left upper eyelid: Secondary | ICD-10-CM | POA: Diagnosis not present

## 2024-02-07 DIAGNOSIS — H04123 Dry eye syndrome of bilateral lacrimal glands: Secondary | ICD-10-CM | POA: Diagnosis not present

## 2024-02-07 DIAGNOSIS — H0288B Meibomian gland dysfunction left eye, upper and lower eyelids: Secondary | ICD-10-CM | POA: Diagnosis not present

## 2024-02-07 DIAGNOSIS — H1045 Other chronic allergic conjunctivitis: Secondary | ICD-10-CM | POA: Diagnosis not present

## 2024-02-07 DIAGNOSIS — H2513 Age-related nuclear cataract, bilateral: Secondary | ICD-10-CM | POA: Diagnosis not present

## 2024-02-07 DIAGNOSIS — H43813 Vitreous degeneration, bilateral: Secondary | ICD-10-CM | POA: Diagnosis not present

## 2024-02-07 DIAGNOSIS — H0288A Meibomian gland dysfunction right eye, upper and lower eyelids: Secondary | ICD-10-CM | POA: Diagnosis not present

## 2024-02-08 DIAGNOSIS — Z85828 Personal history of other malignant neoplasm of skin: Secondary | ICD-10-CM | POA: Diagnosis not present

## 2024-02-08 DIAGNOSIS — D1801 Hemangioma of skin and subcutaneous tissue: Secondary | ICD-10-CM | POA: Diagnosis not present

## 2024-02-08 DIAGNOSIS — L400 Psoriasis vulgaris: Secondary | ICD-10-CM | POA: Diagnosis not present

## 2024-02-08 DIAGNOSIS — L718 Other rosacea: Secondary | ICD-10-CM | POA: Diagnosis not present

## 2024-02-08 DIAGNOSIS — L821 Other seborrheic keratosis: Secondary | ICD-10-CM | POA: Diagnosis not present

## 2024-02-08 DIAGNOSIS — L570 Actinic keratosis: Secondary | ICD-10-CM | POA: Diagnosis not present

## 2024-02-12 ENCOUNTER — Ambulatory Visit: Payer: Medicare PPO

## 2024-03-04 ENCOUNTER — Ambulatory Visit

## 2024-03-04 ENCOUNTER — Ambulatory Visit (INDEPENDENT_AMBULATORY_CARE_PROVIDER_SITE_OTHER)

## 2024-03-04 DIAGNOSIS — R55 Syncope and collapse: Secondary | ICD-10-CM | POA: Diagnosis not present

## 2024-03-05 LAB — CUP PACEART REMOTE DEVICE CHECK
Date Time Interrogation Session: 20250913230957
Implantable Pulse Generator Implant Date: 20230515

## 2024-03-07 ENCOUNTER — Ambulatory Visit: Payer: Self-pay | Admitting: Cardiovascular Disease

## 2024-03-11 NOTE — Progress Notes (Signed)
 Remote Loop Recorder Transmission

## 2024-03-13 NOTE — Progress Notes (Signed)
 Remote Loop Recorder Transmission

## 2024-03-18 ENCOUNTER — Ambulatory Visit: Payer: Medicare PPO

## 2024-03-28 NOTE — Progress Notes (Signed)
 Remote Loop Recorder Transmission

## 2024-04-04 ENCOUNTER — Ambulatory Visit

## 2024-04-05 ENCOUNTER — Ambulatory Visit (INDEPENDENT_AMBULATORY_CARE_PROVIDER_SITE_OTHER)

## 2024-04-05 DIAGNOSIS — R55 Syncope and collapse: Secondary | ICD-10-CM

## 2024-04-07 LAB — CUP PACEART REMOTE DEVICE CHECK
Date Time Interrogation Session: 20251016232044
Implantable Pulse Generator Implant Date: 20230515

## 2024-04-09 NOTE — Progress Notes (Signed)
 Remote Loop Recorder Transmission

## 2024-04-10 ENCOUNTER — Ambulatory Visit: Payer: Self-pay | Admitting: Cardiovascular Disease

## 2024-04-18 ENCOUNTER — Ambulatory Visit

## 2024-05-06 ENCOUNTER — Ambulatory Visit

## 2024-05-06 ENCOUNTER — Ambulatory Visit (INDEPENDENT_AMBULATORY_CARE_PROVIDER_SITE_OTHER)

## 2024-05-06 DIAGNOSIS — I4719 Other supraventricular tachycardia: Secondary | ICD-10-CM

## 2024-05-06 LAB — CUP PACEART REMOTE DEVICE CHECK
Date Time Interrogation Session: 20251116232303
Implantable Pulse Generator Implant Date: 20230515

## 2024-05-08 ENCOUNTER — Telehealth: Payer: Self-pay

## 2024-05-08 ENCOUNTER — Ambulatory Visit

## 2024-05-08 NOTE — Progress Notes (Signed)
 Remote Loop Recorder Transmission

## 2024-05-08 NOTE — Telephone Encounter (Signed)
 Unsuccessful attempts to reach patient on preferred number listed in notes for scheduled AWV. Left message on voicemail okay to reschedule.

## 2024-05-13 ENCOUNTER — Ambulatory Visit: Payer: Self-pay | Admitting: Cardiovascular Disease

## 2024-05-14 DIAGNOSIS — Z85828 Personal history of other malignant neoplasm of skin: Secondary | ICD-10-CM | POA: Diagnosis not present

## 2024-05-14 DIAGNOSIS — L570 Actinic keratosis: Secondary | ICD-10-CM | POA: Diagnosis not present

## 2024-05-14 DIAGNOSIS — L718 Other rosacea: Secondary | ICD-10-CM | POA: Diagnosis not present

## 2024-05-20 ENCOUNTER — Ambulatory Visit

## 2024-05-30 ENCOUNTER — Other Ambulatory Visit: Payer: Self-pay | Admitting: Family Medicine

## 2024-05-30 DIAGNOSIS — E039 Hypothyroidism, unspecified: Secondary | ICD-10-CM

## 2024-05-30 NOTE — Telephone Encounter (Signed)
>  1 year since last visit, please have patient schedule her annual physical with me so we can continue her prescriptions, then ok to give her enough until her appointment time

## 2024-05-30 NOTE — Telephone Encounter (Signed)
 Appt scheduled

## 2024-06-06 ENCOUNTER — Ambulatory Visit

## 2024-06-06 ENCOUNTER — Encounter: Payer: Self-pay | Admitting: Family Medicine

## 2024-06-06 ENCOUNTER — Ambulatory Visit: Admitting: Family Medicine

## 2024-06-06 ENCOUNTER — Ambulatory Visit: Payer: Self-pay | Admitting: Cardiovascular Disease

## 2024-06-06 VITALS — BP 122/78 | HR 77 | Temp 98.5°F | Ht 64.75 in | Wt 148.4 lb

## 2024-06-06 DIAGNOSIS — Z131 Encounter for screening for diabetes mellitus: Secondary | ICD-10-CM | POA: Diagnosis not present

## 2024-06-06 DIAGNOSIS — Z13220 Encounter for screening for lipoid disorders: Secondary | ICD-10-CM

## 2024-06-06 DIAGNOSIS — E039 Hypothyroidism, unspecified: Secondary | ICD-10-CM | POA: Diagnosis not present

## 2024-06-06 DIAGNOSIS — Z0000 Encounter for general adult medical examination without abnormal findings: Secondary | ICD-10-CM

## 2024-06-06 DIAGNOSIS — I4719 Other supraventricular tachycardia: Secondary | ICD-10-CM

## 2024-06-06 LAB — COMPREHENSIVE METABOLIC PANEL WITH GFR
ALT: 32 U/L (ref 3–35)
AST: 49 U/L — ABNORMAL HIGH (ref 5–37)
Albumin: 4.7 g/dL (ref 3.5–5.2)
Alkaline Phosphatase: 74 U/L (ref 39–117)
BUN: 9 mg/dL (ref 6–23)
CO2: 29 meq/L (ref 19–32)
Calcium: 9.1 mg/dL (ref 8.4–10.5)
Chloride: 104 meq/L (ref 96–112)
Creatinine, Ser: 0.61 mg/dL (ref 0.40–1.20)
GFR: 91.7 mL/min (ref >60.00)
Glucose, Bld: 83 mg/dL (ref 70–99)
Potassium: 4.1 meq/L (ref 3.5–5.1)
Sodium: 143 meq/L (ref 135–145)
Total Bilirubin: 0.4 mg/dL (ref 0.2–1.2)
Total Protein: 7.2 g/dL (ref 6.0–8.3)

## 2024-06-06 LAB — CUP PACEART REMOTE DEVICE CHECK
Date Time Interrogation Session: 20251217232519
Implantable Pulse Generator Implant Date: 20230515

## 2024-06-06 LAB — TSH: TSH: 1.65 u[IU]/mL (ref 0.35–5.50)

## 2024-06-06 LAB — HEMOGLOBIN A1C: Hgb A1c MFr Bld: 5.5 % (ref 4.6–6.5)

## 2024-06-06 LAB — LIPID PANEL
Cholesterol: 203 mg/dL — ABNORMAL HIGH (ref 28–200)
HDL: 97.3 mg/dL (ref >39.00)
LDL Cholesterol: 75 mg/dL (ref 10–99)
NonHDL: 106.15
Total CHOL/HDL Ratio: 2
Triglycerides: 155 mg/dL — ABNORMAL HIGH (ref 10.0–149.0)
VLDL: 31 mg/dL (ref 0.0–40.0)

## 2024-06-06 NOTE — Progress Notes (Signed)
 Complete physical exam  Patient: Sabrina Gibbs   DOB: 1955/09/10   68 y.o. Female  MRN: [id number]  Subjective:    Chief Complaint  Patient presents with   Medicare Wellness    Sabrina Gibbs is a 68 y.o. female who presents today for a complete physical exam. She reports consuming a general diet. Eats protein daily, often will eat fruits and veggies, maybe not every day, limits fast food to every 2 weeks. Home exercise routine includes walking 2 hrs per week. She generally feels well. She reports sleeping well. She does not have additional problems to discuss today.    Most recent fall risk assessment:    06/06/2024    1:43 PM  Fall Risk   Falls in the past year? 0  Number falls in past yr: 0  Injury with Fall? 0  Risk for fall due to : No Fall Risks  Follow up Falls evaluation completed     Most recent depression screenings:    06/06/2024    1:47 PM 06/09/2023   11:02 AM  PHQ 2/9 Scores  PHQ - 2 Score 0 0    Vision:Within last year and Dental: No current dental problems and Receives regular dental care  Patient Active Problem List   Diagnosis Date Noted   White coat syndrome without diagnosis of hypertension 03/22/2022   Intramural leiomyoma of uterus 05/12/2021   Thyroid  nodule 05/12/2021   Psoriasis 06/06/2018   Seasonal allergies 06/06/2018   Hypothyroid 06/25/2009      Patient Care Team: Ozell Heron HERO, MD as PCP - General (Family Medicine) Francyne Headland, MD as PCP - Cardiology (Cardiology) Cary Doffing, MD as Consulting Physician (Dermatology) Kristie Lamprey, MD as Consulting Physician (Gastroenterology) Tobie Tonita POUR, DO as Consulting Physician (Neurology)   Show/hide medication list[1]  Review of Systems  HENT:  Negative for hearing loss.   Eyes:  Negative for blurred vision.  Respiratory:  Negative for shortness of breath.   Cardiovascular:  Negative for chest pain.  Gastrointestinal: Negative.   Genitourinary: Negative.    Musculoskeletal:  Negative for back pain.  Neurological:  Negative for headaches.  Psychiatric/Behavioral:  Negative for depression.        Objective:     BP 122/78   Pulse 77   Temp 98.5 F (36.9 C) (Oral)   Ht 5' 4.75 (1.645 m)   Wt 148 lb 6.4 oz (67.3 kg)   LMP 10/19/2010   SpO2 99%   BMI 24.89 kg/m    Physical Exam Vitals reviewed.  Constitutional:      Appearance: Normal appearance. She is well-groomed and normal weight.  HENT:     Right Ear: Tympanic membrane and ear canal normal.     Left Ear: Tympanic membrane and ear canal normal.     Mouth/Throat:     Mouth: Mucous membranes are moist.     Pharynx: No posterior oropharyngeal erythema.  Eyes:     Conjunctiva/sclera: Conjunctivae normal.  Neck:     Thyroid : No thyromegaly.  Cardiovascular:     Rate and Rhythm: Normal rate and regular rhythm.     Pulses: Normal pulses.     Heart sounds: S1 normal and S2 normal.  Pulmonary:     Effort: Pulmonary effort is normal.     Breath sounds: Normal breath sounds and air entry.  Abdominal:     General: Abdomen is flat. Bowel sounds are normal.     Palpations: Abdomen is soft.  Musculoskeletal:  Right lower leg: No edema.     Left lower leg: No edema.  Lymphadenopathy:     Cervical: No cervical adenopathy.  Neurological:     Mental Status: She is alert and oriented to person, place, and time. Mental status is at baseline.     Gait: Gait is intact.  Psychiatric:        Mood and Affect: Mood and affect normal.        Speech: Speech normal.        Behavior: Behavior normal.        Judgment: Judgment normal.      No results found for any visits on 06/06/24.     Assessment & Plan:    Routine Health Maintenance and Physical Exam  Immunization History  Administered Date(s) Administered   Fluad Quad(high Dose 65+) 04/17/2019   Influenza Split 04/15/2011   Influenza Whole 06/25/2009   Influenza, Seasonal, Injecte, Preservative Fre 03/13/2014    Influenza,inj,Quad PF,6+ Mos 05/20/2015, 04/25/2017   Influenza-Unspecified 03/31/2021   PFIZER(Purple Top)SARS-COV-2 Vaccination 07/10/2019, 07/31/2019   Pneumococcal Polysaccharide-23 04/17/2019   Tdap 02/28/2011, 06/09/2023   Zoster Recombinant(Shingrix) 01/19/2020, 04/20/2020    Health Maintenance  Topic Date Due   Pneumococcal Vaccine: 50+ Years (2 of 2 - PCV) 04/16/2020   COVID-19 Vaccine (3 - 2025-26 season) 02/19/2024   Medicare Annual Wellness (AWV)  06/07/2024   Influenza Vaccine  09/17/2024 (Originally 01/19/2024)   Mammogram  09/01/2024   Fecal DNA (Cologuard)  04/12/2025   DTaP/Tdap/Td (3 - Td or Tdap) 06/08/2033   Bone Density Scan  Completed   Hepatitis C Screening  Completed   Zoster Vaccines- Shingrix  Completed   Meningococcal B Vaccine  Aged Out   Colonoscopy  Discontinued    Discussed health benefits of physical activity, and encouraged her to engage in regular exercise appropriate for her age and condition.  Acquired hypothyroidism -     TSH; Future  Lipid screening -     Lipid panel; Future  Routine general medical examination at a health care facility -     Comprehensive metabolic panel with GFR; Future  Diabetes mellitus screening -     Hemoglobin A1c; Future  General physical exam findings are normal today. I reviewed the patient's preventative testing, immunizations, and lifestyle habits. I made appropriate recommendations and placed orders for the appropriate tests and/or vaccinations. I counseled the patient on the CDC's recommendations for healthy exercise and diet. I counseled the patient on healthy sleep habits and stress management. Handouts to reinforce the counseling were given at the conclusion of the visit.    Return in about 1 year (around 06/06/2025) for annual physical exam.     Heron CHRISTELLA Sharper, MD     [1]  Outpatient Medications Prior to Visit  Medication Sig   aspirin 81 MG chewable tablet Chew 81 mg by mouth daily.    cholecalciferol (VITAMIN D ) 1000 units tablet Take 1,000 Units by mouth daily.   Coenzyme Q10 (CO Q 10 PO) Take 200 mg by mouth daily.   COLLAGEN PO Take 100 mg by mouth daily.   fluticasone  (FLONASE ) 50 MCG/ACT nasal spray Place 2 sprays into both nostrils daily.   ibuprofen (ADVIL) 600 MG tablet Take 600 mg by mouth every 6 (six) hours as needed.   levothyroxine  (SYNTHROID ) 50 MCG tablet Take 1 tablet (50 mcg total) by mouth daily.   triamcinolone  cream (KENALOG ) 0.1 % Apply topically.   [DISCONTINUED] hydrocortisone 2.5 % ointment Apply topically.  No facility-administered medications prior to visit.

## 2024-06-06 NOTE — Patient Instructions (Signed)
 Prevnar -20, flu vaccine and RSV vaccine are due-- ok to go to any pharmacy in the area to have these done.

## 2024-06-07 NOTE — Progress Notes (Signed)
 Remote Loop Recorder Transmission

## 2024-06-10 ENCOUNTER — Ambulatory Visit: Payer: Self-pay | Admitting: Family Medicine

## 2024-06-20 ENCOUNTER — Ambulatory Visit

## 2024-06-28 ENCOUNTER — Other Ambulatory Visit: Payer: Self-pay | Admitting: Family Medicine

## 2024-06-28 DIAGNOSIS — E039 Hypothyroidism, unspecified: Secondary | ICD-10-CM

## 2024-07-07 ENCOUNTER — Ambulatory Visit

## 2024-07-07 DIAGNOSIS — R55 Syncope and collapse: Secondary | ICD-10-CM | POA: Diagnosis not present

## 2024-07-08 ENCOUNTER — Ambulatory Visit

## 2024-07-08 LAB — CUP PACEART REMOTE DEVICE CHECK
Date Time Interrogation Session: 20260117232917
Implantable Pulse Generator Implant Date: 20230515

## 2024-07-09 NOTE — Progress Notes (Signed)
 Remote Loop Recorder Transmission

## 2024-07-10 ENCOUNTER — Ambulatory Visit: Payer: Self-pay | Admitting: Cardiovascular Disease

## 2024-07-22 ENCOUNTER — Ambulatory Visit

## 2024-08-07 ENCOUNTER — Ambulatory Visit

## 2024-08-08 ENCOUNTER — Ambulatory Visit

## 2024-08-22 ENCOUNTER — Ambulatory Visit

## 2024-09-07 ENCOUNTER — Ambulatory Visit

## 2024-09-09 ENCOUNTER — Ambulatory Visit

## 2024-09-23 ENCOUNTER — Ambulatory Visit

## 2024-10-08 ENCOUNTER — Ambulatory Visit

## 2024-11-08 ENCOUNTER — Ambulatory Visit

## 2024-12-09 ENCOUNTER — Ambulatory Visit

## 2025-01-09 ENCOUNTER — Ambulatory Visit

## 2025-02-09 ENCOUNTER — Ambulatory Visit

## 2025-03-12 ENCOUNTER — Ambulatory Visit

## 2025-04-12 ENCOUNTER — Ambulatory Visit

## 2025-05-13 ENCOUNTER — Ambulatory Visit

## 2025-06-13 ENCOUNTER — Ambulatory Visit

## 2025-07-14 ENCOUNTER — Ambulatory Visit
